# Patient Record
Sex: Male | Born: 1980
Health system: Southern US, Community
[De-identification: ages and names within clinical notes are randomized; demographics above are authoritative.]

## PROBLEM LIST (undated history)

## (undated) DIAGNOSIS — M109 Gout, unspecified: Secondary | ICD-10-CM

## (undated) DIAGNOSIS — N200 Calculus of kidney: Secondary | ICD-10-CM

## (undated) DIAGNOSIS — I10 Essential (primary) hypertension: Secondary | ICD-10-CM

## (undated) DIAGNOSIS — K219 Gastro-esophageal reflux disease without esophagitis: Secondary | ICD-10-CM

## (undated) DIAGNOSIS — E7800 Pure hypercholesterolemia, unspecified: Secondary | ICD-10-CM

## (undated) DIAGNOSIS — T7840XA Allergy, unspecified, initial encounter: Secondary | ICD-10-CM

## (undated) HISTORY — DX: Allergy, unspecified, initial encounter: T78.40XA

## (undated) HISTORY — PX: TONSILLECTOMY: SUR1361

## (undated) HISTORY — DX: Gout, unspecified: M10.9

## (undated) HISTORY — DX: Gastro-esophageal reflux disease without esophagitis: K21.9

---

## 1999-07-29 ENCOUNTER — Emergency Department (HOSPITAL_COMMUNITY): Admission: EM | Admit: 1999-07-29 | Discharge: 1999-07-29 | Payer: Self-pay | Admitting: Emergency Medicine

## 2007-03-13 ENCOUNTER — Encounter: Admission: RE | Admit: 2007-03-13 | Discharge: 2007-03-13 | Payer: Self-pay | Admitting: Internal Medicine

## 2007-12-04 ENCOUNTER — Emergency Department (HOSPITAL_COMMUNITY): Admission: EM | Admit: 2007-12-04 | Discharge: 2007-12-04 | Payer: Self-pay | Admitting: Emergency Medicine

## 2011-09-11 ENCOUNTER — Emergency Department (HOSPITAL_BASED_OUTPATIENT_CLINIC_OR_DEPARTMENT_OTHER)
Admission: EM | Admit: 2011-09-11 | Discharge: 2011-09-11 | Disposition: A | Discharge status: Home / Self Care | Payer: 59 | Attending: Emergency Medicine | Admitting: Emergency Medicine

## 2011-09-11 ENCOUNTER — Emergency Department (HOSPITAL_BASED_OUTPATIENT_CLINIC_OR_DEPARTMENT_OTHER): Payer: 59

## 2011-09-11 ENCOUNTER — Encounter (HOSPITAL_BASED_OUTPATIENT_CLINIC_OR_DEPARTMENT_OTHER): Payer: Self-pay | Admitting: *Deleted

## 2011-09-11 DIAGNOSIS — N200 Calculus of kidney: Secondary | ICD-10-CM

## 2011-09-11 DIAGNOSIS — R110 Nausea: Secondary | ICD-10-CM | POA: Insufficient documentation

## 2011-09-11 DIAGNOSIS — N201 Calculus of ureter: Secondary | ICD-10-CM | POA: Insufficient documentation

## 2011-09-11 DIAGNOSIS — R109 Unspecified abdominal pain: Secondary | ICD-10-CM | POA: Insufficient documentation

## 2011-09-11 DIAGNOSIS — M545 Low back pain, unspecified: Secondary | ICD-10-CM | POA: Insufficient documentation

## 2011-09-11 DIAGNOSIS — Z79899 Other long term (current) drug therapy: Secondary | ICD-10-CM | POA: Insufficient documentation

## 2011-09-11 LAB — URINALYSIS, ROUTINE W REFLEX MICROSCOPIC
Bilirubin Urine: NEGATIVE
Glucose, UA: NEGATIVE mg/dL

## 2011-09-11 LAB — URINE MICROSCOPIC-ADD ON

## 2011-09-11 MED ORDER — ONDANSETRON HCL 4 MG/2ML IJ SOLN
4.0000 mg | Freq: Once | INTRAMUSCULAR | Status: AC
  Administered 2011-09-11: 4 mg via INTRAVENOUS
  Filled 2011-09-11: qty 2

## 2011-09-11 MED ORDER — OXYCODONE-ACETAMINOPHEN 5-325 MG PO TABS: 2.0000 | ORAL_TABLET | ORAL | Status: AC | PRN

## 2011-09-11 MED ORDER — MORPHINE SULFATE 4 MG/ML IJ SOLN
4.0000 mg | Freq: Once | INTRAMUSCULAR | Status: AC
  Administered 2011-09-11: 4 mg via INTRAVENOUS
  Filled 2011-09-11: qty 1

## 2011-09-11 MED ORDER — KETOROLAC TROMETHAMINE 30 MG/ML IJ SOLN
30.0000 mg | Freq: Once | INTRAMUSCULAR | Status: AC
  Administered 2011-09-11: 30 mg via INTRAVENOUS
  Filled 2011-09-11: qty 1

## 2011-09-11 MED ORDER — MORPHINE SULFATE 4 MG/ML IJ SOLN
6.0000 mg | Freq: Once | INTRAMUSCULAR | Status: AC
  Administered 2011-09-11: 6 mg via INTRAVENOUS
  Filled 2011-09-11: qty 1

## 2011-09-11 MED ORDER — MORPHINE SULFATE 2 MG/ML IJ SOLN
INTRAMUSCULAR | Status: AC
  Administered 2011-09-11: 6 mg via INTRAVENOUS
  Filled 2011-09-11: qty 1

## 2011-09-11 NOTE — ED Notes (Signed)
Patient having lower back pain, left lower abd pain, radiating into groin area. Started appx an hour ago

## 2011-09-11 NOTE — ED Provider Notes (Signed)
History     CSN: [id number]  Arrival date & time 09/11/11  0351   First MD Initiated Contact with Patient 09/11/11 0401      Chief Complaint  Patient presents with  . Abdominal Pain    (Consider location/radiation/quality/duration/timing/severity/associated sxs/prior treatment) HPI Comments: Patient presents tonight with complaints of sudden onset of pain is left lower back area. He states it radiates down to his left lower abdomen, and down into his groin area. He's had some decreased urination, but denies any pain on urination or hematuria. He got some nausea no vomiting. Denies any fevers. He had some mild pain last night and it went away and then came back suddenly tonight about an hour and a half ago and was much more intense this evening. He denies a history of pain like this in the past. Denies a history kidney stones.  Patient is a 31 y.o. male presenting with abdominal pain. The history is provided by the patient.  Abdominal Pain The primary symptoms of the illness include abdominal pain and nausea. The primary symptoms of the illness do not include fever, fatigue, shortness of breath, vomiting, diarrhea or dysuria. The current episode started 1 to 2 hours ago. The onset of the illness was sudden. The problem has been gradually worsening.  The illness is associated with awakening from sleep. The patient has not had a change in bowel habit. Additional symptoms associated with the illness include back pain. Symptoms associated with the illness do not include chills, diaphoresis, hematuria or frequency.    History reviewed. No pertinent past medical history.  History reviewed. No pertinent past surgical history.  No family history on file.  History  Substance Use Topics  . Smoking status: Not on file  . Smokeless tobacco: Not on file  . Alcohol Use: Not on file      Review of Systems  Constitutional: Negative for fever, chills, diaphoresis and fatigue.  HENT: Negative  for congestion, rhinorrhea and sneezing.   Eyes: Negative.   Respiratory: Negative for cough, chest tightness and shortness of breath.   Cardiovascular: Negative for chest pain and leg swelling.  Gastrointestinal: Positive for nausea and abdominal pain. Negative for vomiting, diarrhea and blood in stool.  Genitourinary: Positive for flank pain and decreased urine volume. Negative for dysuria, frequency, hematuria, difficulty urinating and testicular pain.  Musculoskeletal: Positive for back pain. Negative for arthralgias.  Skin: Negative for rash.  Neurological: Negative for dizziness, speech difficulty, weakness, numbness and headaches.    Allergies  Penicillins  Home Medications   Current Outpatient Rx  Name Route Sig Dispense Refill  . FLUTICASONE PROPIONATE 50 MCG/ACT NA SUSP Nasal Place 2 sprays into the nose daily.    . OXYCODONE-ACETAMINOPHEN 5-325 MG PO TABS Oral Take 2 tablets by mouth every 4 (four) hours as needed for pain. 20 tablet 0    BP 178/107  Pulse 64  Resp 20  SpO2 100%  Physical Exam  Constitutional: He is oriented to person, place, and time. He appears well-developed and well-nourished.  HENT:  Head: Normocephalic and atraumatic.  Eyes: Pupils are equal, round, and reactive to light.  Neck: Normal range of motion. Neck supple.  Cardiovascular: Normal rate, regular rhythm and normal heart sounds.   Pulmonary/Chest: Effort normal and breath sounds normal. No respiratory distress. He has no wheezes. He has no rales. He exhibits no tenderness.  Abdominal: Soft. Bowel sounds are normal. There is tenderness. There is no rebound and no guarding.  Moderate tenderness to the left midabdomen and left CVA area. There is no testicular pain. No groin masses are noted  Musculoskeletal: Normal range of motion. He exhibits no edema.  Lymphadenopathy:    He has no cervical adenopathy.  Neurological: He is alert and oriented to person, place, and time.  Skin: Skin is  warm and dry. No rash noted.  Psychiatric: He has a normal mood and affect.    ED Course  Procedures (including critical care time)  Results for orders placed during the hospital encounter of 09/11/11  URINALYSIS, ROUTINE W REFLEX MICROSCOPIC      Component Value Range   Color, Urine YELLOW  YELLOW    APPearance CLEAR  CLEAR    Specific Gravity, Urine 1.027  1.005 - 1.030    pH 5.5  5.0 - 8.0    Glucose, UA NEGATIVE  NEGATIVE (mg/dL)   Hgb urine dipstick TRACE (*) NEGATIVE    Bilirubin Urine NEGATIVE  NEGATIVE    Ketones, ur NEGATIVE  NEGATIVE (mg/dL)   Protein, ur NEGATIVE  NEGATIVE (mg/dL)   Urobilinogen, UA 0.2  0.0 - 1.0 (mg/dL)   Nitrite NEGATIVE  NEGATIVE    Leukocytes, UA NEGATIVE  NEGATIVE   URINE MICROSCOPIC-ADD ON      Component Value Range   Squamous Epithelial / LPF RARE  RARE    WBC, UA 0-2  <3 (WBC/hpf)   RBC / HPF 0-2  <3 (RBC/hpf)   Bacteria, UA FEW (*) RARE    Urine-Other MUCOUS PRESENT     Ct Abdomen Pelvis Wo Contrast  09/11/2011  *RADIOLOGY REPORT*  Clinical Data: Left flank pain radiating to the groin area.  CT ABDOMEN AND PELVIS WITHOUT CONTRAST  Technique:  Multidetector CT imaging of the abdomen and pelvis was performed following the standard protocol without intravenous contrast.  Comparison: None.  Findings: The lung bases are clear.  There is a punctate sized calcification in the distal left ureter at the ureterovesicle junction.  There is mild proximal ureterectasis and pyelocaliectasis with mild periureteral stranding consistent with mild obstruction.  No intrarenal stones are demonstrated.  The right ureter is decompressed.  The bladder is decompressed.  The unenhanced appearance of the liver, spleen, gallbladder, pancreas, adrenal glands, abdominal aorta, and retroperitoneal lymph nodes is unremarkable.  The stomach, small bowel, and colon are decompressed.  No free air or free fluid in the abdomen.  Pelvis:  The appendix is normal.  The prostate gland is  not enlarged.  No free or loculated pelvic fluid collections.  No inflammatory change in the sigmoid colon.  Normal alignment of the lumbar spine.  IMPRESSION: Punctate sized stone in the distal left ureter with mild proximal obstruction.  Original Report Authenticated By: Marlon Pel, M.D.      1. Kidney stone       MDM  Pt with small distal ureteral stone.  No infection.  Given pain meds, urine strainer.  To f/u with urologist.        Rolan Bucco, MD 09/11/11 0500

## 2011-09-11 NOTE — Discharge Instructions (Signed)

## 2013-11-28 ENCOUNTER — Encounter (HOSPITAL_COMMUNITY): Payer: Self-pay | Admitting: Emergency Medicine

## 2013-11-28 ENCOUNTER — Emergency Department (HOSPITAL_COMMUNITY): Payer: Worker's Compensation

## 2013-11-28 ENCOUNTER — Emergency Department (HOSPITAL_COMMUNITY)
Admission: EM | Admit: 2013-11-28 | Discharge: 2013-11-29 | Disposition: A | Discharge status: Home / Self Care | Payer: Worker's Compensation | Attending: Emergency Medicine | Admitting: Emergency Medicine

## 2013-11-28 DIAGNOSIS — R0789 Other chest pain: Secondary | ICD-10-CM | POA: Insufficient documentation

## 2013-11-28 DIAGNOSIS — I10 Essential (primary) hypertension: Secondary | ICD-10-CM | POA: Insufficient documentation

## 2013-11-28 DIAGNOSIS — Z87442 Personal history of urinary calculi: Secondary | ICD-10-CM | POA: Insufficient documentation

## 2013-11-28 DIAGNOSIS — M546 Pain in thoracic spine: Secondary | ICD-10-CM

## 2013-11-28 DIAGNOSIS — Z862 Personal history of diseases of the blood and blood-forming organs and certain disorders involving the immune mechanism: Secondary | ICD-10-CM | POA: Insufficient documentation

## 2013-11-28 DIAGNOSIS — R51 Headache: Secondary | ICD-10-CM | POA: Insufficient documentation

## 2013-11-28 DIAGNOSIS — M549 Dorsalgia, unspecified: Secondary | ICD-10-CM | POA: Insufficient documentation

## 2013-11-28 DIAGNOSIS — Z8639 Personal history of other endocrine, nutritional and metabolic disease: Secondary | ICD-10-CM | POA: Insufficient documentation

## 2013-11-28 DIAGNOSIS — Z880 Allergy status to penicillin: Secondary | ICD-10-CM | POA: Insufficient documentation

## 2013-11-28 DIAGNOSIS — IMO0002 Reserved for concepts with insufficient information to code with codable children: Secondary | ICD-10-CM | POA: Insufficient documentation

## 2013-11-28 HISTORY — DX: Essential (primary) hypertension: I10

## 2013-11-28 HISTORY — DX: Pure hypercholesterolemia, unspecified: E78.00

## 2013-11-28 HISTORY — DX: Calculus of kidney: N20.0

## 2013-11-28 LAB — CBC WITH DIFFERENTIAL/PLATELET
BASOS PCT: 1 % (ref 0–1)
Basophils Absolute: 0.1 10*3/uL (ref 0.0–0.1)
EOS ABS: 0.1 10*3/uL (ref 0.0–0.7)
EOS PCT: 1 % (ref 0–5)
HCT: 42.1 % (ref 39.0–52.0)
Hemoglobin: 14.4 g/dL (ref 13.0–17.0)
LYMPHS ABS: 2 10*3/uL (ref 0.7–4.0)
LYMPHS PCT: 21 % (ref 12–46)
MCH: 29.2 pg (ref 26.0–34.0)
MCHC: 34.2 g/dL (ref 30.0–36.0)
MCV: 85.4 fL (ref 78.0–100.0)
MONO ABS: 0.6 10*3/uL (ref 0.1–1.0)
Monocytes Relative: 6 % (ref 3–12)
NEUTROS ABS: 7 10*3/uL (ref 1.7–7.7)
NEUTROS PCT: 71 % (ref 43–77)
PLATELETS: 269 10*3/uL (ref 150–400)
RBC: 4.93 MIL/uL (ref 4.22–5.81)
RDW: 13.8 % (ref 11.5–15.5)
WBC: 9.8 10*3/uL (ref 4.0–10.5)

## 2013-11-28 LAB — BASIC METABOLIC PANEL
Anion gap: 15 (ref 5–15)
BUN: 17 mg/dL (ref 6–23)
CHLORIDE: 102 meq/L (ref 96–112)
CO2: 24 mEq/L (ref 19–32)
Calcium: 9.3 mg/dL (ref 8.4–10.5)
Creatinine, Ser: 1.37 mg/dL — ABNORMAL HIGH (ref 0.50–1.35)
GFR calc Af Amer: 77 mL/min — ABNORMAL LOW (ref >90)
GFR calc non Af Amer: 66 mL/min — ABNORMAL LOW (ref >90)
GLUCOSE: 109 mg/dL — AB (ref 70–99)
POTASSIUM: 4.1 meq/L (ref 3.7–5.3)
SODIUM: 141 meq/L (ref 137–147)

## 2013-11-28 LAB — I-STAT TROPONIN, ED
Troponin i, poc: 0.01 ng/mL (ref 0.00–0.08)
Troponin i, poc: 0 ng/mL (ref 0.00–0.08)

## 2013-11-28 MED ORDER — METOCLOPRAMIDE HCL 5 MG/ML IJ SOLN
10.0000 mg | Freq: Once | INTRAMUSCULAR | Status: AC
  Administered 2013-11-28: 10 mg via INTRAVENOUS
  Filled 2013-11-28: qty 2

## 2013-11-28 MED ORDER — DIAZEPAM 5 MG PO TABS
5.0000 mg | ORAL_TABLET | Freq: Once | ORAL | Status: AC
  Administered 2013-11-28: 5 mg via ORAL
  Filled 2013-11-28: qty 1

## 2013-11-28 MED ORDER — HYDROMORPHONE HCL PF 1 MG/ML IJ SOLN
0.5000 mg | Freq: Once | INTRAMUSCULAR | Status: AC
  Administered 2013-11-28: 0.5 mg via INTRAVENOUS
  Filled 2013-11-28: qty 1

## 2013-11-28 MED ORDER — DIPHENHYDRAMINE HCL 50 MG/ML IJ SOLN
12.5000 mg | Freq: Once | INTRAMUSCULAR | Status: AC
  Administered 2013-11-28: 12.5 mg via INTRAVENOUS
  Filled 2013-11-28: qty 1

## 2013-11-28 MED ORDER — SODIUM CHLORIDE 0.9 % IV BOLUS (SEPSIS)
1000.0000 mL | Freq: Once | INTRAVENOUS | Status: AC
  Administered 2013-11-28: 1000 mL via INTRAVENOUS

## 2013-11-28 MED ORDER — ORPHENADRINE CITRATE ER 100 MG PO TB12: 100.0000 mg | ORAL_TABLET | Freq: Two times a day (BID) | ORAL | Status: DC | PRN

## 2013-11-28 MED ORDER — ONDANSETRON HCL 4 MG/2ML IJ SOLN
4.0000 mg | Freq: Once | INTRAMUSCULAR | Status: AC
  Administered 2013-11-28: 4 mg via INTRAVENOUS
  Filled 2013-11-28: qty 2

## 2013-11-28 MED ORDER — HYDROCODONE-ACETAMINOPHEN 5-325 MG PO TABS: 1.0000 | ORAL_TABLET | ORAL | Status: DC | PRN

## 2013-11-28 NOTE — ED Notes (Signed)
Patient transported to X-ray 

## 2013-11-28 NOTE — ED Provider Notes (Signed)
CSN: [id number]     Arrival date & time 11/28/13  1804 History   First MD Initiated Contact with Patient 11/28/13 1830     Chief Complaint  Patient presents with  . Back Pain  . Chest Pain     (Consider location/radiation/quality/duration/timing/severity/associated sxs/prior Treatment) HPI   Jose James is a 33 y.o. male with a past medical history of hypertension, high cholesterol, and kidney stones, presenting for left-sided back pain and chest pain. 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 Follow training exercise he acute onset of left-sided thoracic back pain which radiated to his chest. Patient states pain feels like something "boring" into his chest from his back. Patient also states that he was assessed for his blood pressure at that time his blood pressure was fluctuating between 160s/90s and 200s/100s.  Patient states he gets an annual stress test, as part of his fire fighting physical. Stress tests have been negative thus far. Patient was given multiple doses of nitroglycerin prior to arrival, with improvement of his blood pressure, patient states she has a significant headache at this time. Chest pain is now a 6/10. Patient also received aspirin prior to arrival as well. Patient denies nausea, diaphoresis, shortness of breath, abdominal pain, vomiting, fever, chills, or other associated symptoms.   Past Medical History  Diagnosis Date  . Hypertension   . High cholesterol   . Kidney stone    Past Surgical History  Procedure Laterality Date  . Tonsillectomy     No family history on file. History  Substance Use Topics  . Smoking status: Never Smoker   . Smokeless tobacco: Not on file  . Alcohol Use: Yes     Comment: ocassional    Review of Systems  Constitutional: Negative.   Eyes: Negative.   Respiratory: Negative.   Cardiovascular: Positive for chest pain.  Gastrointestinal: Negative.   Endocrine: Negative.    Genitourinary: Negative.   Musculoskeletal: Positive for back pain.  Skin: Negative.   Allergic/Immunologic: Negative.   Neurological: Positive for dizziness, light-headedness and headaches.  Hematological: Negative.   Psychiatric/Behavioral: Negative.       Allergies  Penicillins  Home Medications   Prior to Admission medications   Medication Sig Start Date End Date Taking? Authorizing Provider  HYDROcodone-acetaminophen (NORCO/VICODIN) 5-325 MG per tablet Take 1 tablet by mouth every 4 (four) hours as needed for moderate pain or severe pain. 11/28/13   Gavin PoundJustin Taydem Cavagnaro, MD  orphenadrine (NORFLEX) 100 MG tablet Take 1 tablet (100 mg total) by mouth 2 (two) times daily as needed for muscle spasms. 11/28/13   Gavin PoundJustin Ohn Bostic, MD   BP 135/93  Pulse 87  Temp(Src) 98.7 F (37.1 C) (Oral)  Resp 12  Ht 5\' 11"  (1.803 m)  Wt 236 lb (107.049 kg)  BMI 32.93 kg/m2  SpO2 100% Physical Exam  Nursing note and vitals reviewed. Constitutional: He is oriented to person, place, and time. He appears well-developed and well-nourished. No distress.  HENT:  Head: Normocephalic and atraumatic.  Right Ear: External ear normal.  Left Ear: External ear normal.  Nose: Nose normal.  Mouth/Throat: Oropharynx is clear and moist. No oropharyngeal exudate.  Eyes: Conjunctivae and EOM are normal. Pupils are equal, round, and reactive to light. Right eye exhibits no discharge. Left eye exhibits no discharge. No scleral icterus.  Neck: Normal range of motion. Neck supple. No JVD present. No tracheal deviation present. No thyromegaly present.  Cardiovascular: Normal rate, regular rhythm, normal heart sounds  and intact distal pulses.  Exam reveals no gallop and no friction rub.   No murmur heard. Pulmonary/Chest: Effort normal and breath sounds normal. No stridor. No respiratory distress. He has no wheezes. He has no rales. He exhibits no tenderness.  Abdominal: Soft. Bowel sounds are normal. He exhibits no  distension. There is no tenderness. There is no rebound and no guarding.  Musculoskeletal: Normal range of motion. He exhibits no edema and no tenderness.  Lymphadenopathy:    He has no cervical adenopathy.  Neurological: He is alert and oriented to person, place, and time. He has normal strength and normal reflexes. He is not disoriented. He displays no atrophy, no tremor and normal reflexes. No cranial nerve deficit or sensory deficit. He exhibits normal muscle tone. He displays no seizure activity. GCS eye subscore is 4. GCS verbal subscore is 5. GCS motor subscore is 6.  Skin: Skin is warm and dry. No rash noted. He is not diaphoretic. No erythema. No pallor.  Psychiatric: He has a normal mood and affect. His behavior is normal. Judgment and thought content normal.    ED Course  Procedures (including critical care time) Labs Review Labs Reviewed  BASIC METABOLIC PANEL - Abnormal; Notable for the following:    Glucose, Bld 109 (*)    Creatinine, Ser 1.37 (*)    GFR calc non Af Amer 66 (*)    GFR calc Af Amer 77 (*)    All other components within normal limits  CBC WITH DIFFERENTIAL  I-STAT TROPOININ, ED  Rosezena Sensor, ED    Imaging Review Dg Chest 2 View  11/28/2013   CLINICAL DATA:  Chest pain.  EXAM: CHEST  2 VIEW  COMPARISON:  No priors.  FINDINGS: Lung volumes are normal. No consolidative airspace disease. No pleural effusions. No pneumothorax. No pulmonary nodule or mass noted. Pulmonary vasculature and the cardiomediastinal silhouette are within normal limits.  IMPRESSION: No radiographic evidence of acute cardiopulmonary disease.   Electronically Signed   By: Trudie Reed M.D.   On: 11/28/2013 21:17     EKG Interpretation   Date/Time:  Thursday November 28 2013 18:11:51 EDT Ventricular Rate:  83 PR Interval:  124 QRS Duration: 93 QT Interval:  359 QTC Calculation: 422 R Axis:   47 Text Interpretation:  Sinus rhythm Confirmed by Rubin Payor  MD, Harrold Donath  [phone]) on  11/28/2013 7:15:18 PM      MDM   Final diagnoses:  Left-sided thoracic back pain  Musculoskeletal chest pain   At this time primary concern would be for ACS. Would consider aortic dissection, however patient's pressure is now well controlled, and pain is much improved. Patient otherwise is in no acute distress. Low likelihood of pulmonary cause, has no association with breathing. Chest wall pain is likely in the setting of recent significant activity. Patient does endorse reproduced pain with palpation of his paraspinal muscles, but states that the pain is "deep" to this.  Low concern for PE as patient's heart rate is not tachycardic, and he has normal oxygen saturations, with no associated shortness of breath.  Will obtain chest x-ray, EKG, labs, troponin levels, and will reassess following medications for pain.  Laboratory workup significant for elevated creatinine. Possibly secondary to dehydration. Patient also endorses frequent use of NSAIDs, and has possibly had uncontrolled hypertension for a number of years, but he is unsure of how long he has had hypertension.  No troponin elevation x2 on workup, no other significant abnormalities. Chest x-ray, and EKG within  normal limits. Patient advised to follow up with PCP for further recommendations, and risk modifying medications as indicated. Patient noted improvement in his headache and back pain with muscle relaxants and pain control. We'll DC home with appropriate pain control medication, and muscle relaxants. Patient advised to avoid NSAIDs do to his finding of elevated creatinine, which will also need further PCP followup.  Patient and family given return precautions for chest pain.  Advised to return for worsening symptoms including chest pain, shortness of breath, severe headache, intractable nausea or vomiting, fever, or chills, inability to take medications, or other acute concerns.  Advised to follow up with PCP in 3 days.  Patient and  family in agreement with and expressed understanding of follow plan, plan of care, and return precautions.  All questions answered prior to discharge.  Patient was discharged in stable condition with family, ambulating without difficulty.  Patient care was discussed with my attending, Dr. Rubin Payor.     Gavin Pound, MD 11/30/13 [id number]
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
## 2013-11-28 NOTE — ED Notes (Signed)
EMS-pt reports he started having middle of the back pain radiating to the center of chest with no associated symptoms. 6/10 pain given 324 asa and 1 nitro with no relief. 18g(R)AC. BP 180 right arm BP 160 left arm.

## 2013-11-28 NOTE — Discharge Instructions (Signed)

## 2013-11-29 NOTE — ED Provider Notes (Signed)
  Physical Exam  BP 129/85  Pulse 82  Temp(Src) 98.3 F (36.8 C) (Oral)  Resp 16  Ht 5\' 11"  (1.803 m)  Wt 236 lb (107.049 kg)  BMI 32.93 kg/m2  SpO2 98%  Physical Exam  ED Course  Procedures  MDM Patient chest pain radiating to the back.. doubt aortic pathology. Enzymes negative. Patient has annual stress tests and has another one scheduled in December. Will discharge home.     EKG Interpretation  Date/Time:  Thursday November 28 2013 18:11:51 EDT Ventricular Rate:  83 PR Interval:  124 QRS Duration: 93 QT Interval:  359 QTC Calculation: 422 R Axis:   47 Text Interpretation:  Sinus rhythm Confirmed by Rubin PayorPICKERING  MD, Kiaira Pointer [phone] on 11/28/2013 7:15:18 PM        Juliet RudeNathan R. Rubin PayorPickering, MD 11/29/13 1450

## 2013-11-30 NOTE — ED Provider Notes (Signed)
I saw and evaluated the patient, reviewed the resident's note and I agree with the findings and plan.   EKG Interpretation   Date/Time:  Thursday November 28 2013 18:11:51 EDT Ventricular Rate:  83 PR Interval:  124 QRS Duration: 93 QT Interval:  359 QTC Calculation: 422 R Axis:   47 Text Interpretation:  Sinus rhythm Confirmed by Rubin PayorPICKERING  MD, Shereese Bonnie  [phone]([id number]) on 11/28/2013 7:15:18 PM        Juliet RudeNathan R. Rubin PayorPickering, MD 11/30/13 1430

## 2013-12-30 DIAGNOSIS — I10 Essential (primary) hypertension: Secondary | ICD-10-CM | POA: Insufficient documentation

## 2019-06-01 ENCOUNTER — Other Ambulatory Visit: Payer: Self-pay

## 2019-06-01 ENCOUNTER — Emergency Department (HOSPITAL_COMMUNITY)
Admission: EM | Admit: 2019-06-01 | Discharge: 2019-06-01 | Disposition: A | Discharge status: Home / Self Care | Payer: 59 | Attending: Emergency Medicine | Admitting: Emergency Medicine

## 2019-06-01 ENCOUNTER — Emergency Department (HOSPITAL_COMMUNITY): Payer: 59

## 2019-06-01 DIAGNOSIS — Y9389 Activity, other specified: Secondary | ICD-10-CM | POA: Insufficient documentation

## 2019-06-01 DIAGNOSIS — S5401XA Injury of ulnar nerve at forearm level, right arm, initial encounter: Secondary | ICD-10-CM | POA: Insufficient documentation

## 2019-06-01 DIAGNOSIS — S59901A Unspecified injury of right elbow, initial encounter: Secondary | ICD-10-CM | POA: Diagnosis present

## 2019-06-01 DIAGNOSIS — S5011XA Contusion of right forearm, initial encounter: Secondary | ICD-10-CM | POA: Diagnosis not present

## 2019-06-01 DIAGNOSIS — Y9289 Other specified places as the place of occurrence of the external cause: Secondary | ICD-10-CM | POA: Diagnosis not present

## 2019-06-01 DIAGNOSIS — W010XXA Fall on same level from slipping, tripping and stumbling without subsequent striking against object, initial encounter: Secondary | ICD-10-CM | POA: Insufficient documentation

## 2019-06-01 DIAGNOSIS — Y990 Civilian activity done for income or pay: Secondary | ICD-10-CM | POA: Insufficient documentation

## 2019-06-01 DIAGNOSIS — I10 Essential (primary) hypertension: Secondary | ICD-10-CM | POA: Insufficient documentation

## 2019-06-01 NOTE — Discharge Instructions (Signed)
Make sure you are straightening your arm and stretching.  Avoid hitting the arm and no heavy lifting with that arm.

## 2019-06-01 NOTE — ED Notes (Signed)
Off floor to xray.
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
## 2019-06-01 NOTE — ED Notes (Signed)
Back in  Room

## 2020-07-02 ENCOUNTER — Encounter: Payer: Self-pay | Admitting: Family Medicine

## 2020-07-02 ENCOUNTER — Ambulatory Visit: Payer: 59 | Admitting: Family Medicine

## 2020-07-02 ENCOUNTER — Other Ambulatory Visit: Payer: Self-pay

## 2020-07-02 VITALS — BP 132/83 | HR 83 | Temp 97.9°F | Ht 71.0 in | Wt 250.4 lb

## 2020-07-02 DIAGNOSIS — M109 Gout, unspecified: Secondary | ICD-10-CM | POA: Insufficient documentation

## 2020-07-02 DIAGNOSIS — B001 Herpesviral vesicular dermatitis: Secondary | ICD-10-CM

## 2020-07-02 DIAGNOSIS — I10 Essential (primary) hypertension: Secondary | ICD-10-CM | POA: Diagnosis not present

## 2020-07-02 DIAGNOSIS — H93A9 Pulsatile tinnitus, unspecified ear: Secondary | ICD-10-CM | POA: Diagnosis not present

## 2020-07-02 DIAGNOSIS — Z1211 Encounter for screening for malignant neoplasm of colon: Secondary | ICD-10-CM

## 2020-07-02 MED ORDER — ALLOPURINOL 100 MG PO TABS: 100.0000 mg | ORAL_TABLET | Freq: Every day | ORAL | 3 refills | Status: DC

## 2020-07-02 MED ORDER — LOSARTAN POTASSIUM-HCTZ 100-12.5 MG PO TABS: 1.0000 | ORAL_TABLET | Freq: Every day | ORAL | 3 refills | Status: DC

## 2020-07-02 NOTE — Progress Notes (Signed)
Jose James is a 40 y.o. male who presents today for an office visit.  Assessment/Plan:  Chronic Problems Addressed Today: Gout Refilled allopurinol today.  No recent flares.  We are additionally switching him from lisinopril to losartan.  Hopefully this should help reduce gout flares as well.  Cold sore Stable on Valtrex as needed flares up.  Pulsatile tinnitus Possibly related to blood pressure.  We are making some changes today as below.  Hypertension At goal here in office though has been elevated at home.  We will switch from lisinopril 20 mg twice daily to losartan 100-12.5.  He will check his blood pressures at home over the next few weeks and let me know how they are looking.  He will come back soon for CPE as well.  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  He is particularly worried about thyroid cancer and other forms of cancer.  We will check FOBT to screen for colon cancer.  He will be due for colonoscopy at age 89.  We can check PSA and labs including CBC, CMET, TSH, and urinalysis when he comes back in for CPE.  We will additionally place order for cardiac CT scan to screen for coronary artery disease.  He is up-to-date on other vaccines and screenings.    Subjective:  HPI:  Patient is here to establish care.  See A/P for status of chronic conditions.  He is mostly concerned about blood pressure control.  Home readings have sometimes been into the 140s to 160s over 100s to 110s.  Is also been having some issues with pulsatile tinnitus.  He saw ENT for this.  Had essentially normal work-up there and was told that he may have a blood vessel laying on a bone.   PMH:  The following were reviewed and entered/updated in epic: Past Medical History:  Diagnosis Date  . Allergy   . GERD (gastroesophageal reflux disease)   . Gout   . High cholesterol   . Hypertension   . Kidney stone    Patient Active Problem List    Diagnosis Date Noted  . Pulsatile tinnitus 07/02/2020  . Cold sore 07/02/2020  . Gout 07/02/2020  . Hypertension 12/30/2013   Past Surgical History:  Procedure Laterality Date  . TONSILLECTOMY      Family History  Problem Relation Age of Onset  . Diabetes Maternal Grandfather   . Hypertension Paternal Grandfather     Medications- reviewed and updated Current Outpatient Medications  Medication Sig Dispense Refill  . losartan-hydrochlorothiazide (HYZAAR) 100-12.5 MG tablet Take 1 tablet by mouth daily. 90 tablet 3  . meloxicam (MOBIC) 15 MG tablet Take 15 mg by mouth daily as needed.    . valACYclovir (VALTREX) 1000 MG tablet Take by mouth.    Marland Kitchen allopurinol (ZYLOPRIM) 100 MG tablet Take 1 tablet (100 mg total) by mouth daily. 90 tablet 3  . diazepam (VALIUM) 10 MG tablet TAKE 1 TABLET 2 HOURS PRIOR TO PROCEDURE AND TAKE THE OTHER TABLET IF NEEDED (Patient not taking: Reported on 07/02/2020)     No current facility-administered medications for this visit.    Allergies-reviewed and updated Allergies  Allergen Reactions  . Penicillins Other (See Comments)    Childhood allergy    Social History   Socioeconomic History  . Marital status: Single    Spouse name: Not on file  . Number of children: Not on file  . Years of education: Not on file  . Highest  education level: Not on file  Occupational History  . Not on file  Tobacco Use  . Smoking status: Never Smoker  . Smokeless tobacco: Not on file  Substance and Sexual Activity  . Alcohol use: Yes    Comment: ocassional  . Drug use: No  . Sexual activity: Not on file  Other Topics Concern  . Not on file  Social History Narrative  . Not on file   Social Determinants of Health   Financial Resource Strain: Not on file  Food Insecurity: Not on file  Transportation Needs: Not on file  Physical Activity: Not on file  Stress: Not on file  Social Connections: Not on file        Objective:  Physical Exam: BP 132/83    Pulse 83   Temp 97.9 F (36.6 C) (Temporal)   Ht 5\' 11"  (1.803 m)   Wt 250 lb 6.4 oz (113.6 kg)   SpO2 97%   BMI 34.92 kg/m   Gen: No acute distress, resting comfortably HEENT: TMs clear. CV: Regular rate and rhythm with no murmurs appreciated Pulm: Normal work of breathing, clear to auscultation bilaterally with no crackles, wheezes, or rhonchi Neuro: Grossly normal, moves all extremities Psych: Normal affect and thought content      Illias Pantano M. , MD 07/02/2020 2:07 PM

## 2020-07-02 NOTE — Assessment & Plan Note (Signed)
Refilled allopurinol today.  No recent flares.  We are additionally switching him from lisinopril to losartan.  Hopefully this should help reduce gout flares as well.

## 2020-07-02 NOTE — Assessment & Plan Note (Signed)
Possibly related to blood pressure.  We are making some changes today as below.

## 2020-07-02 NOTE — Assessment & Plan Note (Signed)
At goal here in office though has been elevated at home.  We will switch from lisinopril 20 mg twice daily to losartan 100-12.5.  He will check his blood pressures at home over the next few weeks and let me know how they are looking.  He will come back soon for CPE as well.

## 2020-07-02 NOTE — Patient Instructions (Signed)
It was very nice to see you today!  We will switch your lisinopril to losartan.  Please send a message in a couple weeks to let me know how your blood pressures are looking.  I will place an order for you to get a cardiac CT scan to evaluate for any calcium buildup in your heart.  Please come back soon for your annual physical with labs.  I will see back sooner if needed.  Take care, Dr Jimmey Ralph  PLEASE NOTE:  If you had any lab tests please let us know if you have not heard back within a few days. You may see your results on mychart before we have a chance to review them but we will give you a call once they are reviewed by [place]. If we ordered any referrals today, please let us know if you have not heard from their office within the next week.   Please try these tips to maintain a healthy lifestyle:   Eat at least 3 REAL meals and 1-2 snacks per day.  Aim for no more than 5 hours between eating.  If you eat breakfast, please do so within one hour of getting up.    Each meal should contain half fruits/vegetables, one quarter protein, and one quarter carbs (no bigger than a computer mouse)   Cut down on sweet beverages. This includes juice, soda, and sweet tea.     Drink at least 1 glass of water with each meal and aim for at least 8 glasses per day   Exercise at least 150 minutes every week.

## 2020-07-02 NOTE — Assessment & Plan Note (Signed)
Stable on Valtrex as needed flares up.

## 2020-07-30 ENCOUNTER — Encounter: Payer: Self-pay | Admitting: Family Medicine

## 2020-07-30 NOTE — Telephone Encounter (Signed)
See note

## 2020-08-10 ENCOUNTER — Other Ambulatory Visit: Payer: Self-pay | Admitting: *Deleted

## 2020-08-10 ENCOUNTER — Encounter: Payer: Self-pay | Admitting: Family Medicine

## 2020-08-10 DIAGNOSIS — G4730 Sleep apnea, unspecified: Secondary | ICD-10-CM

## 2020-08-10 NOTE — Telephone Encounter (Signed)
See note

## 2020-08-10 NOTE — Telephone Encounter (Signed)
Referral placed.

## 2020-09-14 ENCOUNTER — Encounter: Payer: Self-pay | Admitting: Family Medicine

## 2020-10-05 NOTE — Telephone Encounter (Signed)
Please see message and advise 

## 2020-10-06 ENCOUNTER — Encounter: Payer: Self-pay | Admitting: Family Medicine

## 2020-10-06 NOTE — Telephone Encounter (Signed)
Pt requesting Rx Meloxicam refill Last refill by historical provider

## 2020-10-07 MED ORDER — MELOXICAM 15 MG PO TABS: 15.0000 mg | ORAL_TABLET | Freq: Every day | ORAL | 1 refills | Status: DC | PRN

## 2020-10-07 NOTE — Telephone Encounter (Signed)
Spoke to pt told him Dr. Jimmey Ralph said okay to fill your Meloxicam Rx. What pharmacy [facility] in [place] or [facility] in [place]? Pt said [facility]. Told him okay will send Rx. Pt verbalized understanding. Rx sent.

## 2020-10-08 NOTE — Telephone Encounter (Signed)
Jose James, can you see if pt can be seen sooner or put on cancellation list.

## 2020-10-15 ENCOUNTER — Ambulatory Visit (INDEPENDENT_AMBULATORY_CARE_PROVIDER_SITE_OTHER)
Admission: RE | Admit: 2020-10-15 | Discharge: 2020-10-15 | Disposition: A | Discharge status: Home / Self Care | Payer: Self-pay | Source: Ambulatory Visit | Attending: Family Medicine | Admitting: Family Medicine

## 2020-10-15 ENCOUNTER — Other Ambulatory Visit: Payer: Self-pay

## 2020-10-15 DIAGNOSIS — I10 Essential (primary) hypertension: Secondary | ICD-10-CM

## 2020-10-16 NOTE — Progress Notes (Signed)
Please inform patient of the following:  Good news! Cardiac score is NORMAL. His lungs are also normal We can recheck again in 5 years.
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
## 2020-11-02 ENCOUNTER — Other Ambulatory Visit: Payer: Self-pay

## 2020-11-02 ENCOUNTER — Ambulatory Visit (INDEPENDENT_AMBULATORY_CARE_PROVIDER_SITE_OTHER): Payer: 59 | Admitting: Internal Medicine

## 2020-11-02 ENCOUNTER — Encounter: Payer: Self-pay | Admitting: Internal Medicine

## 2020-11-02 DIAGNOSIS — R0683 Snoring: Secondary | ICD-10-CM | POA: Diagnosis not present

## 2020-11-02 DIAGNOSIS — K219 Gastro-esophageal reflux disease without esophagitis: Secondary | ICD-10-CM | POA: Insufficient documentation

## 2020-11-02 DIAGNOSIS — G4726 Circadian rhythm sleep disorder, shift work type: Secondary | ICD-10-CM | POA: Insufficient documentation

## 2020-11-02 DIAGNOSIS — G4733 Obstructive sleep apnea (adult) (pediatric): Secondary | ICD-10-CM | POA: Insufficient documentation

## 2020-11-02 NOTE — Assessment & Plan Note (Signed)
This has awakened him and may need more attention if persistent.

## 2020-11-02 NOTE — Assessment & Plan Note (Signed)
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

## 2020-11-02 NOTE — Assessment & Plan Note (Signed)
History and exam favor obstructive sleep apnea. Appropriate discussion, questions answered. Safe driving emphasized.  Plan- sleep study, then likely CPAP would be first choice

## 2020-11-02 NOTE — Patient Instructions (Signed)
Order- schedule home sleep test    dx Snoring  Please call for results and recommendations about 2 weeks after your sleep study. If appropriate, we may be able to go ahead and start treatment before we see you next.

## 2020-11-16 ENCOUNTER — Telehealth: Payer: Self-pay | Admitting: Pulmonary Disease

## 2020-11-16 DIAGNOSIS — R0683 Snoring: Secondary | ICD-10-CM

## 2020-11-16 NOTE — Telephone Encounter (Signed)
Patient was seen on 11/02/20 by Dr. Maple Hudson in the notes states to do home sleep test. No order was placed

## 2020-11-16 NOTE — Telephone Encounter (Signed)
Called patient. I apologize for the order not being placed on 11/02/20. He is aware that I will place the order today. He verbalized understanding.   Nothing further needed at time of call.

## 2020-11-17 ENCOUNTER — Institutional Professional Consult (permissible substitution): Payer: 59 | Admitting: Pulmonary Disease

## 2020-11-28 ENCOUNTER — Other Ambulatory Visit: Payer: Self-pay | Admitting: Family Medicine

## 2020-12-02 ENCOUNTER — Ambulatory Visit: Payer: 59

## 2020-12-02 ENCOUNTER — Other Ambulatory Visit: Payer: Self-pay

## 2020-12-02 DIAGNOSIS — R0683 Snoring: Secondary | ICD-10-CM

## 2020-12-02 DIAGNOSIS — G4733 Obstructive sleep apnea (adult) (pediatric): Secondary | ICD-10-CM | POA: Diagnosis not present

## 2020-12-11 DIAGNOSIS — G4733 Obstructive sleep apnea (adult) (pediatric): Secondary | ICD-10-CM | POA: Diagnosis not present

## 2020-12-16 ENCOUNTER — Telehealth: Payer: Self-pay | Admitting: Internal Medicine

## 2020-12-16 DIAGNOSIS — G4733 Obstructive sleep apnea (adult) (pediatric): Secondary | ICD-10-CM

## 2020-12-16 NOTE — Telephone Encounter (Signed)
Noted.   AO please advise of HST results. Thanks :)

## 2020-12-22 NOTE — Telephone Encounter (Signed)
Home sleep test showed severe obstructive sleep apnea, averaging 38 apmeas/ hour. I recommend CPAP  Order- new DME, new CPAP auto 5-20, mask of choice, humidifier, supplies, AirView/ card  He needs ROV for CPAP in 31-90 days after he gets machine, per insurance regs.

## 2020-12-22 NOTE — Telephone Encounter (Signed)
Msg originally sent to Dr Wynona Neat instead of Dr Maple Hudson  I called C[facility] but there was no answer- left msg on machine letting her know we will ask CY results and call her  Dr Maple Hudson- please advise o HST results, thanks!

## 2020-12-22 NOTE — Telephone Encounter (Signed)
Cameo wife is checking on results of patient's home sleep test. [facility] phone number is [phone].

## 2020-12-22 NOTE — Telephone Encounter (Signed)
Spoke with the pt and notified of results of sleep study. Pt verbalized understanding and was agreeable to CPAP therapy. I have placed DME referral for this and pt aware to contact the office for 31-90 day f/u once they begin using machine per insurance requirement.   

## 2021-01-26 ENCOUNTER — Ambulatory Visit (INDEPENDENT_AMBULATORY_CARE_PROVIDER_SITE_OTHER): Payer: 59 | Admitting: Family Medicine

## 2021-01-26 ENCOUNTER — Encounter: Payer: Self-pay | Admitting: Family Medicine

## 2021-01-26 ENCOUNTER — Other Ambulatory Visit: Payer: Self-pay

## 2021-01-26 VITALS — BP 136/97 | HR 78 | Temp 98.0°F | Ht 70.0 in | Wt 250.6 lb

## 2021-01-26 DIAGNOSIS — Z125 Encounter for screening for malignant neoplasm of prostate: Secondary | ICD-10-CM

## 2021-01-26 DIAGNOSIS — I159 Secondary hypertension, unspecified: Secondary | ICD-10-CM

## 2021-01-26 DIAGNOSIS — G4733 Obstructive sleep apnea (adult) (pediatric): Secondary | ICD-10-CM

## 2021-01-26 DIAGNOSIS — Z0001 Encounter for general adult medical examination with abnormal findings: Secondary | ICD-10-CM | POA: Diagnosis not present

## 2021-01-26 DIAGNOSIS — Z9989 Dependence on other enabling machines and devices: Secondary | ICD-10-CM

## 2021-01-26 DIAGNOSIS — M109 Gout, unspecified: Secondary | ICD-10-CM | POA: Diagnosis not present

## 2021-01-26 DIAGNOSIS — Z13220 Encounter for screening for lipoid disorders: Secondary | ICD-10-CM | POA: Diagnosis not present

## 2021-01-26 DIAGNOSIS — Z1211 Encounter for screening for malignant neoplasm of colon: Secondary | ICD-10-CM

## 2021-01-26 DIAGNOSIS — Z131 Encounter for screening for diabetes mellitus: Secondary | ICD-10-CM

## 2021-01-26 LAB — LIPID PANEL
Cholesterol: 242 mg/dL — ABNORMAL HIGH (ref 0–200)
HDL: 34.3 mg/dL — ABNORMAL LOW (ref >39.00)
NonHDL: 208.12
Total CHOL/HDL Ratio: 7
Triglycerides: 278 mg/dL — ABNORMAL HIGH (ref 0.0–149.0)
VLDL: 55.6 mg/dL — ABNORMAL HIGH (ref 0.0–40.0)

## 2021-01-26 LAB — URINALYSIS, ROUTINE W REFLEX MICROSCOPIC
Bilirubin Urine: NEGATIVE
Hgb urine dipstick: NEGATIVE
Ketones, ur: NEGATIVE
Leukocytes,Ua: NEGATIVE
Nitrite: NEGATIVE
RBC / HPF: NONE SEEN (ref 0–?)
Specific Gravity, Urine: 1.025 (ref 1.000–1.030)
Total Protein, Urine: NEGATIVE
Urine Glucose: NEGATIVE
Urobilinogen, UA: 0.2 (ref 0.0–1.0)
pH: 6 (ref 5.0–8.0)

## 2021-01-26 LAB — HEMOGLOBIN A1C: Hgb A1c MFr Bld: 6.3 % (ref 4.6–6.5)

## 2021-01-26 LAB — CBC
HCT: 42.7 % (ref 39.0–52.0)
Hemoglobin: 14.4 g/dL (ref 13.0–17.0)
MCHC: 33.7 g/dL (ref 30.0–36.0)
MCV: 86.5 fl (ref 78.0–100.0)
Platelets: 302 10*3/uL (ref 150.0–400.0)
RBC: 4.94 Mil/uL (ref 4.22–5.81)
RDW: 13.8 % (ref 11.5–15.5)
WBC: 7.6 10*3/uL (ref 4.0–10.5)

## 2021-01-26 LAB — COMPREHENSIVE METABOLIC PANEL
ALT: 55 U/L — ABNORMAL HIGH (ref 0–53)
AST: 29 U/L (ref 0–37)
Albumin: 4.5 g/dL (ref 3.5–5.2)
Alkaline Phosphatase: 55 U/L (ref 39–117)
BUN: 16 mg/dL (ref 6–23)
CO2: 28 mEq/L (ref 19–32)
Calcium: 9.7 mg/dL (ref 8.4–10.5)
Chloride: 104 mEq/L (ref 96–112)
Creatinine, Ser: 1.33 mg/dL (ref 0.40–1.50)
GFR: 66.49 mL/min (ref >60.00)
Glucose, Bld: 112 mg/dL — ABNORMAL HIGH (ref 70–99)
Potassium: 4.5 mEq/L (ref 3.5–5.1)
Sodium: 140 mEq/L (ref 135–145)
Total Bilirubin: 0.8 mg/dL (ref 0.2–1.2)
Total Protein: 6.6 g/dL (ref 6.0–8.3)

## 2021-01-26 LAB — PSA: PSA: 0.31 ng/mL (ref 0.10–4.00)

## 2021-01-26 LAB — VITAMIN B12: Vitamin B-12: 235 pg/mL (ref 211–911)

## 2021-01-26 LAB — LDL CHOLESTEROL, DIRECT: Direct LDL: 158 mg/dL

## 2021-01-26 LAB — TSH: TSH: 3.62 u[IU]/mL (ref 0.35–5.50)

## 2021-01-26 LAB — URIC ACID: Uric Acid, Serum: 8.2 mg/dL — ABNORMAL HIGH (ref 4.0–7.8)

## 2021-01-26 NOTE — Patient Instructions (Signed)
It was very nice to see you today!  We will check blood work, urine sample, and a stool study today.  Please continue working with your CPAP machine.  Depending on the results your blood work we may need to start a weight loss medication.  We should continue working on diet and exercise.  I will see back in 1 year for your next physical.  Come back to see me sooner if needed.  Take care, Dr Jose James  PLEASE NOTE:  If you had any lab tests please let us know if you have not heard back within a few days. You may see your results on mychart before we have a chance to review them but we will give you a call once they are reviewed by [place]. If we ordered any referrals today, please let us know if you have not heard from their office within the next week.   Please try these tips to maintain a healthy lifestyle:  Eat at least 3 REAL meals and 1-2 snacks per day.  Aim for no more than 5 hours between eating.  If you eat breakfast, please do so within one hour of getting up.   Each meal should contain half fruits/vegetables, one quarter protein, and one quarter carbs (no bigger than a computer mouse)  Cut down on sweet beverages. This includes juice, soda, and sweet tea.   Drink at least 1 glass of water with each meal and aim for at least 8 glasses per day  Exercise at least 150 minutes every week.    Preventive Care 38-98 Years Old, Male Preventive care refers to lifestyle choices and visits with your health care provider that can promote health and wellness. This includes: A yearly physical exam. This is also called an annual wellness visit. Regular dental and eye exams. Immunizations. Screening for certain conditions. Healthy lifestyle choices, such as: Eating a healthy diet. Getting regular exercise. Not using drugs or products that contain nicotine and tobacco. Limiting alcohol use. What can I expect for my preventive care visit? Physical exam Your health care provider may check  your: Height and weight. These may be used to calculate your BMI (body mass index). BMI is a measurement that tells if you are at a healthy weight. Heart rate and blood pressure. Body temperature. Skin for abnormal spots. Counseling Your health care provider may ask you questions about your: Past medical problems. Family's medical history. Alcohol, tobacco, and drug use. Emotional well-being. Home life and relationship well-being. Sexual activity. Diet, exercise, and sleep habits. Work and work Astronomer. Access to firearms. What immunizations do I need? Vaccines are usually given at various ages, according to a schedule. Your health care provider will recommend vaccines for you based on your age, medical history, and lifestyle or other factors, such as travel or where you work. What tests do I need? Blood tests Lipid and cholesterol levels. These may be checked every 5 years starting at age 44. Hepatitis C test. Hepatitis B test. Screening  Diabetes screening. This is done by checking your blood sugar (glucose) after you have not eaten for a while (fasting). Genital exam to check for testicular cancer or hernias. STD (sexually transmitted disease) testing, if you are at risk. Talk with your health care provider about your test results, treatment options, and if necessary, the need for more tests. Follow these instructions at home: Eating and drinking  Eat a healthy diet that includes fresh fruits and vegetables, whole grains, lean protein, and low-fat dairy  products. Drink enough fluid to keep your urine pale yellow. Take vitamin and mineral supplements as recommended by your health care provider. Do not drink alcohol if your health care provider tells you not to drink. If you drink alcohol: Limit how much you have to 0-2 drinks a day. Be aware of how much alcohol is in your drink. In the [place] one drink equals one 12 oz bottle of beer (355 mL), one 5 oz glass of wine (148  mL), or one 1 oz glass of hard liquor (44 mL). Lifestyle Take daily care of your teeth and gums. Brush your teeth every morning and night with fluoride toothpaste. Floss one time each day. Stay active. Exercise for at least 30 minutes 5 or more days each week. Do not use any products that contain nicotine or tobacco, such as cigarettes, e-cigarettes, and chewing tobacco. If you need help quitting, ask your health care provider. Do not use drugs. If you are sexually active, practice safe sex. Use a condom or other form of protection to prevent STIs (sexually transmitted infections). Find healthy ways to cope with stress, such as: Meditation, yoga, or listening to music. Journaling. Talking to a trusted person. Spending time with friends and family. Safety Always wear your seat belt while driving or riding in a vehicle. Do not drive: If you have been drinking alcohol. Do not ride with someone who has been drinking. When you are tired or distracted. While texting. Wear a helmet and other protective equipment during sports activities. If you have firearms in your house, make sure you follow all gun safety procedures. Seek help if you have been physically or sexually abused. What's next? Go to your health care provider once a year for an annual wellness visit. Ask your health care provider how often you should have your eyes and teeth checked. Stay up to date on all vaccines. This information is not intended to replace advice given to you by your health care provider. Make sure you discuss any questions you have with your health care provider. Document Revised: 06/12/2020 Document Reviewed: 03/29/2018 Elsevier Patient Education  2022 [employer].

## 2021-01-26 NOTE — Assessment & Plan Note (Signed)
Discussed lifestyle modifications.  He is working on diet and exercise for this is difficult due to his work schedule and issues with fatigue.  We will check labs today.  Would consider starting semaglutide with NGITJL. If labs are normal.

## 2021-01-26 NOTE — Progress Notes (Signed)
Chief Complaint:  Jose James is a 40 y.o. male who presents today for his annual comprehensive physical exam.    Assessment/Plan:  New/Acute Problems: Fatigue Likely due to severe OSA.  We will check labs today to rule out any other possible causes.  Chronic Problems Addressed Today: Morbid obesity (HCC) Discussed lifestyle modifications.  He is working on diet and exercise for this is difficult due to his work schedule and issues with fatigue.  We will check labs today.  Would consider starting semaglutide with NATFTD. If labs are normal.  OSA on CPAP Follows with pulmonology.  On CPAP.  Likely contributing to his fatigue.  Gout No recent flares.  Continue allopurinol daily and meloxicam as needed.  Check uric acid level today.  Hypertension Diastolic elevated today.  He has been out of meds for the last couple of days.  We will check labs today.  Hopefully this will improve as he is treated for his OSA.  Continue home monitoring goal 140/90 or lower.   Body mass index is 35.96 kg/m. / Obese  BMI Metric Follow Up - 01/26/21 0956       BMI Metric Follow Up-Please document annually   BMI Metric Follow Up Education provided              Preventative Healthcare: Check labs.  Works as a IT sales professional.  We will check urinalysis and FOBT as well.  We will check early PSA.  Patient Counseling(The following topics were reviewed and/or handout was given):  -Nutrition: Stressed importance of moderation in sodium/caffeine intake, saturated fat and cholesterol, caloric balance, sufficient intake of fresh fruits, vegetables, and fiber.  -Stressed the importance of regular exercise.   -Substance Abuse: Discussed cessation/primary prevention of tobacco, alcohol, or other drug use; driving or other dangerous activities under the influence; availability of treatment for abuse.   -Injury prevention: Discussed safety belts, safety helmets, smoke detector, smoking near bedding or  upholstery.   -Sexuality: Discussed sexually transmitted diseases, partner selection, use of condoms, avoidance of unintended pregnancy and contraceptive alternatives.   -Dental health: Discussed importance of regular tooth brushing, flossing, and dental visits.  -Health maintenance and immunizations reviewed. Please refer to Health maintenance section.  Return to care in 1 year for next preventative visit.     Subjective:  HPI:  He has no acute complaints today.   He still have issue with sleep apnea. He reports he feels fatigued. He wakes up at night. He recently had sleep study done which showed OSA. He started CPAP a week ago. He thinks it is helping. He would like to get blood work done for this issue. He is also concerned about weight gain. He is having hard time losing weight. He would like to start [facility] if insurance improved.   Lifestyle Diet: Trying to follow balance diet Exercise: Limited.   Depression screen PHQ 2/9 01/26/2021  Decreased Interest 0  Down, Depressed, Hopeless 0  PHQ - 2 Score 0    Health Maintenance Due  Topic Date Due   HIV Screening  Never done   Hepatitis C Screening  Never done     ROS: Per HPI, otherwise a complete review of systems was negative.   PMH:  The following were reviewed and entered/updated in epic: Past Medical History:  Diagnosis Date   Allergy    GERD (gastroesophageal reflux disease)    Gout    High cholesterol    Hypertension    Kidney stone    Patient  Active Problem List   Diagnosis Date Noted   Morbid obesity (HCC) 01/26/2021   OSA on CPAP 11/02/2020   Shift work sleep disorder 11/02/2020   GERD (gastroesophageal reflux disease)    Pulsatile tinnitus 07/02/2020   Cold sore 07/02/2020   Gout 07/02/2020   Hypertension 12/30/2013   Past Surgical History:  Procedure Laterality Date   TONSILLECTOMY      Family History  Problem Relation Age of Onset   Diabetes Maternal Grandfather    Hypertension Paternal  Grandfather     Medications- reviewed and updated Current Outpatient Medications  Medication Sig Dispense Refill   allopurinol (ZYLOPRIM) 100 MG tablet Take 1 tablet (100 mg total) by mouth daily. 90 tablet 3   losartan-hydrochlorothiazide (HYZAAR) 100-12.5 MG tablet Take 1 tablet by mouth daily. 90 tablet 3   meloxicam (MOBIC) 15 MG tablet TAKE 1 TABLET(15 MG) BY MOUTH DAILY AS NEEDED 30 tablet 1   valACYclovir (VALTREX) 1000 MG tablet Take by mouth.     No current facility-administered medications for this visit.    Allergies-reviewed and updated Allergies  Allergen Reactions   Penicillins Other (See Comments)    Childhood allergy    Social History   Socioeconomic History   Marital status: Divorced    Spouse name: Not on file   Number of children: Not on file   Years of education: Not on file   Highest education level: Not on file  Occupational History   Not on file  Tobacco Use   Smoking status: Never   Smokeless tobacco: Not on file  Vaping Use   Vaping Use: Never used  Substance and Sexual Activity   Alcohol use: Yes    Comment: ocassional   Drug use: No   Sexual activity: Not on file  Other Topics Concern   Not on file  Social History Narrative   Not on file   Social Determinants of Health   Financial Resource Strain: Not on file  Food Insecurity: Not on file  Transportation Needs: Not on file  Physical Activity: Not on file  Stress: Not on file  Social Connections: Not on file        Objective:  Physical Exam: BP (!) 136/97   Pulse 78   Temp 98 F (36.7 C) (Temporal)   Ht 5\' 10"  (1.778 m)   Wt 250 lb 9.6 oz (113.7 kg)   SpO2 98%   BMI 35.96 kg/m   Body mass index is 35.96 kg/m. Wt Readings from Last 3 Encounters:  01/26/21 250 lb 9.6 oz (113.7 kg)  11/02/20 252 lb 3.2 oz (114.4 kg)  07/02/20 250 lb 6.4 oz (113.6 kg)   Gen: NAD, resting comfortably HEENT: TMs normal bilaterally. OP clear. No thyromegaly noted.  CV: RRR with no murmurs  appreciated Pulm: NWOB, CTAB with no crackles, wheezes, or rhonchi GI: Normal bowel sounds present. Soft, Nontender, Nondistended. MSK: no edema, cyanosis, or clubbing noted Skin: warm, dry Neuro: CN2-12 grossly intact. Strength 5/5 in upper and lower extremities. Reflexes symmetric and intact bilaterally.  Psych: Normal affect and thought content      I,Savera Zaman,acting as a scribe for 07/04/20, MD.,have documented all relevant documentation on the behalf of Jacquiline Doe, MD,as directed by  Jacquiline Doe, MD while in the presence of Jacquiline Doe, MD.   I, Jacquiline Doe, MD, have reviewed all documentation for this visit. The documentation on 01/26/21 for the exam, diagnosis, procedures, and orders are all accurate and complete.  03/28/21.  Jimmey Ralph, MD 01/26/2021 9:57 AM

## 2021-01-26 NOTE — Assessment & Plan Note (Signed)
No recent flares.  Continue allopurinol daily and meloxicam as needed.  Check uric acid level today.

## 2021-01-26 NOTE — Assessment & Plan Note (Signed)
Follows with pulmonology.  On CPAP.  Likely contributing to his fatigue.

## 2021-01-26 NOTE — Assessment & Plan Note (Signed)
Diastolic elevated today.  He has been out of meds for the last couple of days.  We will check labs today.  Hopefully this will improve as he is treated for his OSA.  Continue home monitoring goal 140/90 or lower.

## 2021-01-27 ENCOUNTER — Other Ambulatory Visit: Payer: Self-pay | Admitting: Family Medicine

## 2021-01-27 ENCOUNTER — Telehealth: Payer: Self-pay

## 2021-01-27 ENCOUNTER — Other Ambulatory Visit: Payer: Self-pay | Admitting: *Deleted

## 2021-01-27 MED ORDER — OZEMPIC (0.25 OR 0.5 MG/DOSE) 2 MG/1.5ML ~~LOC~~ SOPN: 0.2500 mg | PEN_INJECTOR | SUBCUTANEOUS | 0 refills | Status: DC

## 2021-01-27 NOTE — Telephone Encounter (Signed)
Patient is calling in stating insurance is requiring a PA on the Ozempic.

## 2021-01-27 NOTE — Progress Notes (Signed)
Please inform patient of the following:  His blood sugar is borderline diabetic. Recommend starting wegovy or ozempic as we discussed at his office visit. Ok to send in 0.25mg  once weekly. I would like for him to do this for 4 weeks and check in with [place] at that time to discuss increasing the dose.  His cholesterol levels are elevated. I hope this will improve with weight loss. Do not need to start any cholesterol medications at this time.   His number is elevated. We can increase his allopurinol to 300mg  daily. Please send in new rx if needed. We can recheck this in 6-12 months.  Everything els is NORMAL.  8-12. Katina Degree, MD 01/27/2021 8:10 AM

## 2021-01-28 ENCOUNTER — Other Ambulatory Visit: Payer: Self-pay | Admitting: Family Medicine

## 2021-01-28 NOTE — Telephone Encounter (Signed)
PA (Key: [id number]) Ozempic (0.25 or 0.5 MG/DOSE) 2MG /1.5ML pen-injectors Send today  Waiting for determination

## 2021-01-29 ENCOUNTER — Other Ambulatory Visit: Payer: Self-pay | Admitting: *Deleted

## 2021-01-29 MED ORDER — OZEMPIC (0.25 OR 0.5 MG/DOSE) 2 MG/1.5ML ~~LOC~~ SOPN: 0.2500 mg | PEN_INJECTOR | SUBCUTANEOUS | 0 refills | Status: DC

## 2021-01-29 NOTE — Telephone Encounter (Signed)
Request Reference Number: [id number]. OZEMPIC INJ 2/1.5ML is approved through 01/28/2022 Pharmacy notified

## 2021-02-24 ENCOUNTER — Other Ambulatory Visit: Payer: Self-pay | Admitting: Family Medicine

## 2021-03-28 ENCOUNTER — Other Ambulatory Visit: Payer: Self-pay | Admitting: Family Medicine

## 2021-04-28 ENCOUNTER — Encounter: Payer: Self-pay | Admitting: Family Medicine

## 2021-04-29 NOTE — Telephone Encounter (Signed)
I agree with him going to the 0.5mg  dose. He should check in with [place] in 1-2 months.  Katina Degree. Jimmey Ralph, MD 04/29/2021 9:44 AM

## 2021-04-29 NOTE — Telephone Encounter (Signed)
See note

## 2021-05-20 ENCOUNTER — Other Ambulatory Visit: Payer: Self-pay | Admitting: Family Medicine

## 2021-05-20 ENCOUNTER — Encounter: Payer: Self-pay | Admitting: Family Medicine

## 2021-05-20 MED ORDER — VALACYCLOVIR HCL 1 G PO TABS: 1000.0000 mg | ORAL_TABLET | Freq: Every day | ORAL | 0 refills | Status: DC

## 2021-07-01 ENCOUNTER — Encounter: Payer: Self-pay | Admitting: Family Medicine

## 2021-07-01 ENCOUNTER — Ambulatory Visit: Payer: 59 | Admitting: Family Medicine

## 2021-07-01 VITALS — BP 145/104 | HR 73 | Temp 98.1°F | Ht 70.0 in | Wt 238.6 lb

## 2021-07-01 DIAGNOSIS — H93A9 Pulsatile tinnitus, unspecified ear: Secondary | ICD-10-CM

## 2021-07-01 DIAGNOSIS — I10 Essential (primary) hypertension: Secondary | ICD-10-CM | POA: Diagnosis not present

## 2021-07-01 DIAGNOSIS — I159 Secondary hypertension, unspecified: Secondary | ICD-10-CM

## 2021-07-01 MED ORDER — SEMAGLUTIDE (1 MG/DOSE) 4 MG/3ML ~~LOC~~ SOPN: 1.0000 mg | PEN_INJECTOR | SUBCUTANEOUS | 5 refills | Status: DC

## 2021-07-01 NOTE — Progress Notes (Signed)
? ?  Jose James is a 41 y.o. male who presents today for an office visit. ? ?Assessment/Plan:  ?Chronic Problems Addressed Today: ?Pulsatile tinnitus ?Still having bothersome symptoms that seem to be progressing.  Reassuring exam today without any obvious deficits.  He did have an ENT evaluation a couple of years ago but does not seem like any imaging was done.  We will check MRI to rule out any possible vascular or other causes.  He will likely need to follow back up with ENT depending on results. ? ?Morbid obesity (HCC) ?Is down about 12 pounds since last visit.  We will increase Ozempic to 1mg  weekly.  We will follow-up in a few weeks via MyChart. ? ?Hypertension ?Slightly elevated today though patient has not taken blood pressure meds today.  We will continue current dose losartan-HCTZ 100 - 12.5.  Continue home monitoring goal 140/90 or lower. ? ? ?  ?Subjective:  ?HPI: ? ?Patient here to follow up on pulsatile tinnitus. He has been heartbeat in his left ear. This has been going on for couple of years year or so. He notes symptoms get worse when laying down at night. He also noticed increased beat whenever his heart rate goes up. He is concern this might be cancer. He notes he had mole on left side since he was kid. He has noticed some itching in that area. He had not changed anything other than shampoo. He saw ENT in the past.  No weakness or numbness. No reported vision changes.  ? ? ?He is trying to lose weight. He is down about 12 pounds since last visit. He is currently on Ozempic for the past 2 months. This has been helping with weight loss. He is tolerating his medication. No side effects.  He would like to try higher Ozempic dose.  ? ? ?   ?  ?Objective:  ?Physical Exam: ?BP (!) 145/104 (BP Location: Left Arm)   Pulse 73   Temp 98.1 ?F (36.7 ?C) (Temporal)   Ht 5\' 10"  (1.778 m)   Wt 238 lb 9.6 oz (108.2 kg)   SpO2 98%   BMI 34.24 kg/m?   ?Gen: No acute distress, resting comfortably ?HEENT: TMs  clear bilaterally. ?CV: Regular rate and rhythm with no murmurs appreciated.  No carotid bruits. ?Pulm: Normal work of breathing, clear to auscultation bilaterally with no crackles, wheezes, or rhonchi ?Neuro: Grossly normal, moves all extremities ?Psych: Normal affect and thought content ? ?   ? ?I,Savera Zaman,acting as a for , MD.,have documented all relevant documentation on the behalf of Neurosurgeon, MD,as directed by  Jacquiline Doe, MD while in the presence of Jacquiline Doe, MD.  ? ?I, Jacquiline Doe, MD, have reviewed all documentation for this visit. The documentation on 07/01/21 for the exam, diagnosis, procedures, and orders are all accurate and complete. ? ?Jacquiline Doe. 07/03/21, MD ?07/01/2021 9:03 AM  ? ?

## 2021-07-01 NOTE — Assessment & Plan Note (Signed)
Slightly elevated today though patient has not taken blood pressure meds today.  We will continue current dose losartan-HCTZ 100 - 12.5.  Continue home monitoring goal 140/90 or lower. ?

## 2021-07-01 NOTE — Assessment & Plan Note (Signed)
Is down about 12 pounds since last visit.  We will increase Ozempic to 1mg  weekly.  We will follow-up in a few weeks via MyChart. ?

## 2021-07-01 NOTE — Assessment & Plan Note (Signed)
Still having bothersome symptoms that seem to be progressing.  Reassuring exam today without any obvious deficits.  He did have an ENT evaluation a couple of years ago but does not seem like any imaging was done.  We will check MRI to rule out any possible vascular or other causes.  He will likely need to follow back up with ENT depending on results. ?

## 2021-07-01 NOTE — Patient Instructions (Signed)
It was very nice to see you today! ? ?We will order a MRA of your head to rule out any concerning causes for your pulsatile tinnitus. ? ?We may need to have you follow back up with ENT depending on the results. ? ?Please increase your Ozempic to 1 mg weekly.  Sending message in a few weeks to let me know how you are doing with this. ? ?Take care, ?Dr [name] ? ?PLEASE NOTE: ? ?If you had any lab tests please let us know if you have not heard back within a few days. You may see your results on mychart before we have a chance to review them but we will give you a call once they are reviewed by [place]. If we ordered any referrals today, please let us know if you have not heard from their office within the next week.  ? ?Please try these tips to maintain a healthy lifestyle: ? ?Eat at least 3 REAL meals and 1-2 snacks per day.  Aim for no more than 5 hours between eating.  If you eat breakfast, please do so within one hour of getting up.  ? ?Each meal should contain half fruits/vegetables, one quarter protein, and one quarter carbs (no bigger than a computer mouse) ? ?Cut down on sweet beverages. This includes juice, soda, and sweet tea.  ? ?Drink at least 1 glass of water with each meal and aim for at least 8 glasses per day ? ?Exercise at least 150 minutes every week.   ?

## 2021-07-15 ENCOUNTER — Encounter: Payer: Self-pay | Admitting: Family Medicine

## 2021-07-20 ENCOUNTER — Ambulatory Visit
Admission: RE | Admit: 2021-07-20 | Discharge: 2021-07-20 | Disposition: A | Discharge status: Home / Self Care | Payer: 59 | Source: Ambulatory Visit | Attending: Family Medicine | Admitting: Family Medicine

## 2021-07-20 DIAGNOSIS — H93A9 Pulsatile tinnitus, unspecified ear: Secondary | ICD-10-CM

## 2021-07-22 NOTE — Progress Notes (Signed)
Please inform patient of the following: ? ?Good news! His MRI did show any abnormalities. Do not need to do any further testing at this point. Recommend he follow back up with ENT to discuss further management for his tinnitus if he wishes.

## 2021-07-26 ENCOUNTER — Other Ambulatory Visit: Payer: Self-pay

## 2021-07-26 ENCOUNTER — Encounter: Payer: Self-pay | Admitting: Family Medicine

## 2021-07-26 DIAGNOSIS — H93A9 Pulsatile tinnitus, unspecified ear: Secondary | ICD-10-CM

## 2021-07-26 NOTE — Telephone Encounter (Signed)
Ok to place another order for different provider ?

## 2021-07-26 NOTE — Telephone Encounter (Signed)
Ok to place referral. ? ?Katina Degree. Jimmey Ralph, MD ?07/26/2021 12:19 PM  ? ?

## 2021-07-26 NOTE — Telephone Encounter (Signed)
ENT Referral placed and patient contacted via MyChart msg to advise. ?

## 2021-08-12 ENCOUNTER — Other Ambulatory Visit: Payer: Self-pay | Admitting: Family Medicine

## 2021-10-04 ENCOUNTER — Other Ambulatory Visit: Payer: Self-pay | Admitting: *Deleted

## 2021-10-04 MED ORDER — MELOXICAM 15 MG PO TABS: ORAL_TABLET | ORAL | 1 refills | Status: DC

## 2022-01-03 ENCOUNTER — Telehealth: Payer: Self-pay | Admitting: *Deleted

## 2022-01-03 NOTE — Telephone Encounter (Signed)
(  Key: [id number]) Ozempic (0.25 or 0.5 MG/DOSE) 2MG /1.5ML pen-injectors Waiting for determination

## 2022-01-07 NOTE — Telephone Encounter (Signed)
Request Reference Number: [id number]. OZEMPIC INJ 2/1.5ML is approved through 01/04/2023.

## 2022-01-10 ENCOUNTER — Encounter: Payer: Self-pay | Admitting: *Deleted
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
## 2022-01-28 ENCOUNTER — Ambulatory Visit (INDEPENDENT_AMBULATORY_CARE_PROVIDER_SITE_OTHER): Payer: 59 | Admitting: Family Medicine

## 2022-01-28 ENCOUNTER — Encounter: Payer: Self-pay | Admitting: Family Medicine

## 2022-01-28 ENCOUNTER — Encounter: Payer: Self-pay | Admitting: Internal Medicine

## 2022-01-28 ENCOUNTER — Ambulatory Visit (INDEPENDENT_AMBULATORY_CARE_PROVIDER_SITE_OTHER): Payer: 59 | Admitting: Internal Medicine

## 2022-01-28 VITALS — BP 126/88 | HR 71 | Ht 70.0 in | Wt 244.4 lb

## 2022-01-28 VITALS — BP 133/91 | HR 57 | Temp 97.5°F | Ht 70.0 in | Wt 241.6 lb

## 2022-01-28 DIAGNOSIS — Z13220 Encounter for screening for lipoid disorders: Secondary | ICD-10-CM

## 2022-01-28 DIAGNOSIS — I159 Secondary hypertension, unspecified: Secondary | ICD-10-CM | POA: Diagnosis not present

## 2022-01-28 DIAGNOSIS — R739 Hyperglycemia, unspecified: Secondary | ICD-10-CM | POA: Diagnosis not present

## 2022-01-28 DIAGNOSIS — M109 Gout, unspecified: Secondary | ICD-10-CM | POA: Diagnosis not present

## 2022-01-28 DIAGNOSIS — Z125 Encounter for screening for malignant neoplasm of prostate: Secondary | ICD-10-CM | POA: Diagnosis not present

## 2022-01-28 DIAGNOSIS — Z1159 Encounter for screening for other viral diseases: Secondary | ICD-10-CM

## 2022-01-28 DIAGNOSIS — Z1211 Encounter for screening for malignant neoplasm of colon: Secondary | ICD-10-CM

## 2022-01-28 DIAGNOSIS — R109 Unspecified abdominal pain: Secondary | ICD-10-CM | POA: Diagnosis not present

## 2022-01-28 DIAGNOSIS — Z0001 Encounter for general adult medical examination with abnormal findings: Secondary | ICD-10-CM

## 2022-01-28 DIAGNOSIS — G4733 Obstructive sleep apnea (adult) (pediatric): Secondary | ICD-10-CM | POA: Diagnosis not present

## 2022-01-28 LAB — COMPREHENSIVE METABOLIC PANEL
ALT: 41 U/L (ref 0–53)
AST: 24 U/L (ref 0–37)
Albumin: 4.4 g/dL (ref 3.5–5.2)
Alkaline Phosphatase: 48 U/L (ref 39–117)
BUN: 18 mg/dL (ref 6–23)
CO2: 28 mEq/L (ref 19–32)
Calcium: 9.4 mg/dL (ref 8.4–10.5)
Chloride: 102 mEq/L (ref 96–112)
Creatinine, Ser: 1.39 mg/dL (ref 0.40–1.50)
GFR: 62.62 mL/min (ref >60.00)
Glucose, Bld: 109 mg/dL — ABNORMAL HIGH (ref 70–99)
Potassium: 4.3 mEq/L (ref 3.5–5.1)
Sodium: 138 mEq/L (ref 135–145)
Total Bilirubin: 1 mg/dL (ref 0.2–1.2)
Total Protein: 6.5 g/dL (ref 6.0–8.3)

## 2022-01-28 LAB — CBC
HCT: 44.1 % (ref 39.0–52.0)
Hemoglobin: 15 g/dL (ref 13.0–17.0)
MCHC: 34 g/dL (ref 30.0–36.0)
MCV: 88.2 fl (ref 78.0–100.0)
Platelets: 238 10*3/uL (ref 150.0–400.0)
RBC: 5 Mil/uL (ref 4.22–5.81)
RDW: 14.4 % (ref 11.5–15.5)
WBC: 6.8 10*3/uL (ref 4.0–10.5)

## 2022-01-28 LAB — LIPID PANEL
Cholesterol: 227 mg/dL — ABNORMAL HIGH (ref 0–200)
HDL: 34.8 mg/dL — ABNORMAL LOW (ref >39.00)
NonHDL: 192.32
Total CHOL/HDL Ratio: 7
Triglycerides: 305 mg/dL — ABNORMAL HIGH (ref 0.0–149.0)
VLDL: 61 mg/dL — ABNORMAL HIGH (ref 0.0–40.0)

## 2022-01-28 LAB — URINALYSIS, ROUTINE W REFLEX MICROSCOPIC
Bilirubin Urine: NEGATIVE
Hgb urine dipstick: NEGATIVE
Ketones, ur: NEGATIVE
Leukocytes,Ua: NEGATIVE
Nitrite: NEGATIVE
Specific Gravity, Urine: 1.015 (ref 1.000–1.030)
Total Protein, Urine: NEGATIVE
Urine Glucose: NEGATIVE
Urobilinogen, UA: 0.2 (ref 0.0–1.0)
pH: 6.5 (ref 5.0–8.0)

## 2022-01-28 LAB — LDL CHOLESTEROL, DIRECT: Direct LDL: 141 mg/dL

## 2022-01-28 LAB — PSA: PSA: 0.37 ng/mL (ref 0.10–4.00)

## 2022-01-28 LAB — TSH: TSH: 5.43 u[IU]/mL (ref 0.35–5.50)

## 2022-01-28 LAB — HEMOGLOBIN A1C: Hgb A1c MFr Bld: 6 % (ref 4.6–6.5)

## 2022-01-28 LAB — URIC ACID: Uric Acid, Serum: 7.6 mg/dL (ref 4.0–7.8)

## 2022-01-28 LAB — VITAMIN B12: Vitamin B-12: 177 pg/mL — ABNORMAL LOW (ref 211–911)

## 2022-01-28 MED ORDER — OZEMPIC (0.25 OR 0.5 MG/DOSE) 2 MG/3ML ~~LOC~~ SOPN: PEN_INJECTOR | SUBCUTANEOUS | 1 refills | Status: DC

## 2022-01-28 NOTE — Progress Notes (Signed)
Chief Complaint:  Jose James is a 41 y.o. male who presents today for his annual comprehensive physical exam.    Assessment/Plan:  New/Acute Problems: Left Flank Pain Concern for recurrent nephrolithiasis.  No red flag signs or symptoms.  We will check labs today and UA.  Depending on results may need to get a CT scan to evaluate for nephrolithiasis.  We will follow-up in a couple weeks via [place].  Chronic Problems Addressed Today: Morbid obesity ([place]) He has previously done well with Ozempic however stopped several weeks ago due to concern for side effects that he had read on the news.  He had been tolerating well without any significant side effects personally.  We discussed with patient that gastroparesis is very uncommon and is reversible upon stopping the medication.  He is agreeable to start back.  We will go back with 0.25 mg weekly for 4 weeks and then titrate the dose as needed.  Gout No recent flares.  Check uric acid level.  We will continue allopurinol 100 mg daily.  Hypertension Slightly elevated today though typically well controlled.  Continue current dose losartan-HCTZ 100-12.5 once daily.  Check labs today.   Preventative Healthcare: Check Labs.  Up-to-date on vaccines.  We will check PSA and FOBT to screen for colon cancer per patient request.  Patient Counseling(The following topics were reviewed and/or handout was given):  -Nutrition: Stressed importance of moderation in sodium/caffeine intake, saturated fat and cholesterol, caloric balance, sufficient intake of fresh fruits, vegetables, and fiber.  -Stressed the importance of regular exercise.   -Substance Abuse: Discussed cessation/primary prevention of tobacco, alcohol, or other drug use; driving or other dangerous activities under the influence; availability of treatment for abuse.   -Injury prevention: Discussed safety belts, safety helmets, smoke detector, smoking near bedding or upholstery.    -Sexuality: Discussed sexually transmitted diseases, partner selection, use of condoms, avoidance of unintended pregnancy and contraceptive alternatives.   -Dental health: Discussed importance of regular tooth brushing, flossing, and dental visits.  -Health maintenance and immunizations reviewed. Please refer to Health maintenance section.  Return to care in 1 year for next preventative visit.     Subjective:  HPI:  He has no acute complaints today.   He has had a pain/pressure sensation in his left flank. Worse at night when laying on that side. This has been going on for several years. No nausea or vomiting. He did have issues with kidney stones in the past. He has not noticed   Lifestyle Diet: Trying to cut down on portions.      01/28/2022    7:58 AM  Depression screen PHQ 2/9  Decreased Interest 0  Down, Depressed, Hopeless 0  PHQ - 2 Score 0    There are no preventive care reminders to display for this patient.   ROS: Per HPI, otherwise a complete review of systems was negative.   PMH:  The following were reviewed and entered/updated in epic: Past Medical History:  Diagnosis Date   Allergy    GERD (gastroesophageal reflux disease)    Gout    High cholesterol    Hypertension    Kidney stone    Patient Active Problem List   Diagnosis Date Noted   Morbid obesity ([place]) 01/26/2021   OSA on CPAP 11/02/2020   Shift work sleep disorder 11/02/2020   GERD (gastroesophageal reflux disease)    Pulsatile tinnitus 07/02/2020   Cold sore 07/02/2020   Gout 07/02/2020   Hypertension 12/30/2013   Past  Surgical History:  Procedure Laterality Date   TONSILLECTOMY      Family History  Problem Relation Age of Onset   Diabetes Maternal Grandfather    Hypertension Paternal Grandfather     Medications- reviewed and updated Current Outpatient Medications  Medication Sig Dispense Refill   allopurinol (ZYLOPRIM) 100 MG tablet TAKE 1 TABLET(100 MG) BY MOUTH DAILY 90 tablet  3   losartan-hydrochlorothiazide (HYZAAR) 100-12.5 MG tablet TAKE 1 TABLET BY MOUTH DAILY 90 tablet 3   meloxicam (MOBIC) 15 MG tablet TAKE 1 TABLET(15 MG) BY MOUTH DAILY AS NEEDED 30 tablet 1   Semaglutide, 1 MG/DOSE, 4 MG/3ML SOPN Inject 1 mg as directed once a week. 3 mL 5   Semaglutide,0.25 or 0.5MG /DOS, (OZEMPIC, 0.25 OR 0.5 MG/DOSE,) 2 MG/3ML SOPN Take 0.25mg  weekly for 4 weeks then increase to 0.5mg  weekly 3 mL 1   valACYclovir (VALTREX) 1000 MG tablet Take 1 tablet (1,000 mg total) by mouth daily. 90 tablet 0   No current facility-administered medications for this visit.    Allergies-reviewed and updated Allergies  Allergen Reactions   Penicillins Other (See Comments)    Childhood allergy    Social History   Socioeconomic History   Marital status: Divorced    Spouse name: Not on file   Number of children: Not on file   Years of education: Not on file   Highest education level: Not on file  Occupational History   Not on file  Tobacco Use   Smoking status: Never   Smokeless tobacco: Not on file  Vaping Use   Vaping Use: Never used  Substance and Sexual Activity   Alcohol use: Yes    Comment: ocassional   Drug use: No   Sexual activity: Not on file  Other Topics Concern   Not on file  Social History Narrative   Not on file   Social Determinants of Health   Financial Resource Strain: Not on file  Food Insecurity: Not on file  Transportation Needs: Not on file  Physical Activity: Not on file  Stress: Not on file  Social Connections: Not on file        Objective:  Physical Exam: BP (!) 133/91 (BP Location: Left Arm, Patient Position: Sitting)   Pulse (!) 57   Temp (!) 97.5 F (36.4 C) (Temporal)   Ht 5\' 10"  (1.778 m)   Wt 241 lb 9.6 oz (109.6 kg)   SpO2 96%   BMI 34.67 kg/m   Body mass index is 34.67 kg/m. Wt Readings from Last 3 Encounters:  01/28/22 241 lb 9.6 oz (109.6 kg)  07/01/21 238 lb 9.6 oz (108.2 kg)  01/26/21 250 lb 9.6 oz (113.7 kg)    Gen: NAD, resting comfortably HEENT: TMs normal bilaterally. OP clear. No thyromegaly noted.  CV: RRR with no murmurs appreciated Pulm: NWOB, CTAB with no crackles, wheezes, or rhonchi GI: Normal bowel sounds present. Soft, Nontender, Nondistended. MSK: no edema, cyanosis, or clubbing noted Skin: warm, dry Neuro: CN2-12 grossly intact. Strength 5/5 in upper and lower extremities. Reflexes symmetric and intact bilaterally.  Psych: Normal affect and thought content     Isais Klipfel M. 03/28/21, MD 01/28/2022 8:37 AM

## 2022-01-28 NOTE — Assessment & Plan Note (Signed)
No recent flares.  Check uric acid level.  We will continue allopurinol 100 mg daily.

## 2022-01-28 NOTE — Assessment & Plan Note (Signed)
He has previously done well with Ozempic however stopped several weeks ago due to concern for side effects that he had read on the news.  He had been tolerating well without any significant side effects personally.  We discussed with patient that gastroparesis is very uncommon and is reversible upon stopping the medication.  He is agreeable to start back.  We will go back with 0.25 mg weekly for 4 weeks and then titrate the dose as needed.

## 2022-01-28 NOTE — Patient Instructions (Addendum)
Order- DME [facility]- continue CPAP auto 4-20, mask of choice, humidifier, supplies, [employer]/ card.   Please refit mask of choice.  You can look into getting a spare machine.  Travel CPAP machines like AirMini or Transcend are available at on-line sources like [employer]. You want an autopap machine.   You can try otc nasal saline gel (any brand) for dry nose if you aren't using the humidifier.

## 2022-01-28 NOTE — Assessment & Plan Note (Addendum)
Slightly elevated today though typically well controlled.  Continue current dose losartan-HCTZ 100-12.5 once daily.  Check labs today.

## 2022-01-28 NOTE — Patient Instructions (Signed)
It was very nice to see you today!  We will check blood work.  We will check a urine sample.  Please start back on the Ozempic.  We may need to get a CT scan depending on the results of your urine sample.  Please follow-up with me in a few weeks limit how you are doing.  I will see you back in year for your next physical.  Come back sooner if needed.  Take care, Dr [name]  PLEASE NOTE:  If you had any lab tests please let us know if you have not heard back within a few days. You may see your results on mychart before we have a chance to review them but we will give you a call once they are reviewed by [place]. If we ordered any referrals today, please let us know if you have not heard from their office within the next week.   Please try these tips to maintain a healthy lifestyle:  Eat at least 3 REAL meals and 1-2 snacks per day.  Aim for no more than 5 hours between eating.  If you eat breakfast, please do so within one hour of getting up.   Each meal should contain half fruits/vegetables, one quarter protein, and one quarter carbs (no bigger than a computer mouse)  Cut down on sweet beverages. This includes juice, soda, and sweet tea.   Drink at least 1 glass of water with each meal and aim for at least 8 glasses per day  Exercise at least 150 minutes every week.    Preventive Care 42-38 Years Old, Male Preventive care refers to lifestyle choices and visits with your health care provider that can promote health and wellness. Preventive care visits are also called wellness exams. What can I expect for my preventive care visit? Counseling During your preventive care visit, your health care provider may ask about your: Medical history, including: Past medical problems. Family medical history. Current health, including: Emotional well-being. Home life and relationship well-being. Sexual activity. Lifestyle, including: Alcohol, nicotine or tobacco, and drug use. Access to  firearms. Diet, exercise, and sleep habits. Safety issues such as seatbelt and bike helmet use. Sunscreen use. Work and work Statistician. Physical exam Your health care provider will check your: Height and weight. These may be used to calculate your BMI (body mass index). BMI is a measurement that tells if you are at a healthy weight. Waist circumference. This measures the distance around your waistline. This measurement also tells if you are at a healthy weight and may help predict your risk of certain diseases, such as type 2 diabetes and high blood pressure. Heart rate and blood pressure. Body temperature. Skin for abnormal spots. What immunizations do I need?  Vaccines are usually given at various ages, according to a schedule. Your health care provider will recommend vaccines for you based on your age, medical history, and lifestyle or other factors, such as travel or where you work. What tests do I need? Screening Your health care provider may recommend screening tests for certain conditions. This may include: Lipid and cholesterol levels. Diabetes screening. This is done by checking your blood sugar (glucose) after you have not eaten for a while (fasting). Hepatitis B test. Hepatitis C test. HIV (human immunodeficiency virus) test. STI (sexually transmitted infection) testing, if you are at risk. Lung cancer screening. Prostate cancer screening. Colorectal cancer screening. Talk with your health care provider about your test results, treatment options, and if necessary, the need for  more tests. Follow these instructions at home: Eating and drinking  Eat a diet that includes fresh fruits and vegetables, whole grains, lean protein, and low-fat dairy products. Take vitamin and mineral supplements as recommended by your health care provider. Do not drink alcohol if your health care provider tells you not to drink. If you drink alcohol: Limit how much you have to 0-2 drinks a  day. Know how much alcohol is in your drink. In the [place] one drink equals one 12 oz bottle of beer (355 mL), one 5 oz glass of wine (148 mL), or one 1 oz glass of hard liquor (44 mL). Lifestyle Brush your teeth every morning and night with fluoride toothpaste. Floss one time each day. Exercise for at least 30 minutes 5 or more days each week. Do not use any products that contain nicotine or tobacco. These products include cigarettes, chewing tobacco, and vaping devices, such as e-cigarettes. If you need help quitting, ask your health care provider. Do not use drugs. If you are sexually active, practice safe sex. Use a condom or other form of protection to prevent STIs. Take aspirin only as told by your health care provider. Make sure that you understand how much to take and what form to take. Work with your health care provider to find out whether it is safe and beneficial for you to take aspirin daily. Find healthy ways to manage stress, such as: Meditation, yoga, or listening to music. Journaling. Talking to a trusted person. Spending time with friends and family. Minimize exposure to UV radiation to reduce your risk of skin cancer. Safety Always wear your seat belt while driving or riding in a vehicle. Do not drive: If you have been drinking alcohol. Do not ride with someone who has been drinking. When you are tired or distracted. While texting. If you have been using any mind-altering substances or drugs. Wear a helmet and other protective equipment during sports activities. If you have firearms in your house, make sure you follow all gun safety procedures. What's next? Go to your health care provider once a year for an annual wellness visit. Ask your health care provider how often you should have your eyes and teeth checked. Stay up to date on all vaccines. This information is not intended to replace advice given to you by your health care provider. Make sure you discuss any  questions you have with your health care provider. Document Revised: 09/30/2020 Document Reviewed: 09/30/2020 Elsevier Patient Education  [place].

## 2022-01-31 NOTE — Progress Notes (Signed)
Please inform patient of the following:  Cholesterol and blood sugar both borderline elevated but better than last year.  He should continue to work on diet and exercise and we can recheck these in a year.  His B12 is low.  Recommend he take 1000 mcg daily.  We should recheck this again in 3 to 6 months.  The rest of his labs are all normal and we can recheck in a year.

## 2022-02-01 ENCOUNTER — Other Ambulatory Visit: Payer: Self-pay

## 2022-02-01 DIAGNOSIS — E538 Deficiency of other specified B group vitamins: Secondary | ICD-10-CM

## 2022-02-02 ENCOUNTER — Encounter: Payer: Self-pay | Admitting: Internal Medicine

## 2022-02-02 NOTE — Assessment & Plan Note (Signed)
Hopefully he can continue weight loss.  Ozempic seems to be making a difference.

## 2022-02-02 NOTE — Assessment & Plan Note (Addendum)
Benefits from CPAP with good compliance and control.  Comfort measures emphasized.  Discussed ways to deal with nasal dryness.  Discussed availability of travel CPAP machines. Plan-nasal saline gel, adjust humidifier setting.  Continue auto 4-20

## 2022-02-05 ENCOUNTER — Other Ambulatory Visit: Payer: Self-pay | Admitting: Family Medicine

## 2022-03-03 ENCOUNTER — Telehealth: Payer: Self-pay | Admitting: Family Medicine

## 2022-03-03 NOTE — Telephone Encounter (Signed)
Patient states he left his stool sample kit at home and will not be back in town until 03/07/22.  Patient requests to be called at ph# [phone] to be advised if he should come to office to pick up another sample kit or will the sample he already did still be good until 03/07/22.

## 2022-03-03 NOTE — Telephone Encounter (Signed)
Please advise and I will contact pt

## 2022-03-03 NOTE — Telephone Encounter (Signed)
The kit he has will still be good as long as it has been unused.  Jose James. [name], MD 03/03/2022 12:38 PM

## 2022-03-03 NOTE — Telephone Encounter (Signed)
I called pt and pt stated he will be able to pick up another stool sample kit due to him using the other but leaving it at home. I Let Joellen Jersey know pt will be picking up stool sample kit today.

## 2022-03-07 ENCOUNTER — Other Ambulatory Visit (INDEPENDENT_AMBULATORY_CARE_PROVIDER_SITE_OTHER): Payer: 59

## 2022-03-07 DIAGNOSIS — Z0001 Encounter for general adult medical examination with abnormal findings: Secondary | ICD-10-CM | POA: Diagnosis not present

## 2022-03-07 LAB — FECAL OCCULT BLOOD, IMMUNOCHEMICAL: Fecal Occult Bld: NEGATIVE

## 2022-03-07 NOTE — Progress Notes (Signed)
Please inform patient of the following:  Great news! His stool sample was normal.  We can recheck in a year.

## 2022-04-06 ENCOUNTER — Other Ambulatory Visit: Payer: Self-pay | Admitting: Family Medicine

## 2022-04-20 ENCOUNTER — Telehealth: Payer: Self-pay | Admitting: Family Medicine

## 2022-04-20 ENCOUNTER — Encounter: Payer: Self-pay | Admitting: Family Medicine

## 2022-04-20 NOTE — Telephone Encounter (Signed)
Please advise 

## 2022-04-20 NOTE — Telephone Encounter (Signed)
Patient states: -Currently is having a gout flare up  - Has been taking allopurinol as directed  Patient requests: -PCP send in a medication to help with this to [facility] at [address] [place] [address]

## 2022-04-21 ENCOUNTER — Other Ambulatory Visit: Payer: Self-pay | Admitting: *Deleted

## 2022-04-21 NOTE — Telephone Encounter (Signed)
Please have him schedule an appointment ASAP.  Jose James. [name], MD 04/21/2022 2:58 PM

## 2022-04-21 NOTE — Telephone Encounter (Signed)
Ok to send in new rx for ozempic 0.5mg  weekly. Would like for him to follow up with [place] in a few weeks after starting the higher dose.  Algis Greenhouse. [name], MD 04/21/2022 2:47 PM

## 2022-04-22 ENCOUNTER — Other Ambulatory Visit: Payer: Self-pay | Admitting: *Deleted

## 2022-04-22 MED ORDER — OZEMPIC (0.25 OR 0.5 MG/DOSE) 2 MG/3ML ~~LOC~~ SOPN: 0.5000 mg | PEN_INJECTOR | SUBCUTANEOUS | 1 refills | Status: DC

## 2022-04-22 NOTE — Telephone Encounter (Signed)
Informed pt of PCP message. Patient states he was hurting a couple of days ago but the flare up has resolved. I offered patient earliest OV on 04/26/22 @ 11:20am but he declined. States he will call back if he changes his mind.

## 2022-05-08 ENCOUNTER — Encounter: Payer: Self-pay | Admitting: Family Medicine

## 2022-05-09 ENCOUNTER — Ambulatory Visit: Payer: 59 | Admitting: Family

## 2022-05-09 ENCOUNTER — Emergency Department (HOSPITAL_BASED_OUTPATIENT_CLINIC_OR_DEPARTMENT_OTHER): Payer: 59

## 2022-05-09 ENCOUNTER — Encounter (HOSPITAL_BASED_OUTPATIENT_CLINIC_OR_DEPARTMENT_OTHER): Payer: Self-pay

## 2022-05-09 ENCOUNTER — Emergency Department (HOSPITAL_BASED_OUTPATIENT_CLINIC_OR_DEPARTMENT_OTHER)
Admission: EM | Admit: 2022-05-09 | Discharge: 2022-05-09 | Disposition: A | Discharge status: Home / Self Care | Payer: 59 | Attending: Emergency Medicine | Admitting: Emergency Medicine

## 2022-05-09 ENCOUNTER — Ambulatory Visit: Payer: 59 | Admitting: Physician Assistant

## 2022-05-09 ENCOUNTER — Other Ambulatory Visit: Payer: Self-pay

## 2022-05-09 ENCOUNTER — Encounter: Payer: Self-pay | Admitting: Physician Assistant

## 2022-05-09 ENCOUNTER — Emergency Department (HOSPITAL_COMMUNITY): Payer: 59

## 2022-05-09 VITALS — BP 140/92 | HR 77 | Temp 98.0°F | Ht 70.0 in | Wt 229.0 lb

## 2022-05-09 DIAGNOSIS — R42 Dizziness and giddiness: Secondary | ICD-10-CM

## 2022-05-09 DIAGNOSIS — I639 Cerebral infarction, unspecified: Secondary | ICD-10-CM | POA: Insufficient documentation

## 2022-05-09 DIAGNOSIS — R1110 Vomiting, unspecified: Secondary | ICD-10-CM | POA: Insufficient documentation

## 2022-05-09 DIAGNOSIS — I10 Essential (primary) hypertension: Secondary | ICD-10-CM

## 2022-05-09 DIAGNOSIS — I159 Secondary hypertension, unspecified: Secondary | ICD-10-CM

## 2022-05-09 LAB — BASIC METABOLIC PANEL
Anion gap: 9 (ref 5–15)
BUN: 19 mg/dL (ref 6–20)
CO2: 28 mmol/L (ref 22–32)
Calcium: 10 mg/dL (ref 8.9–10.3)
Chloride: 102 mmol/L (ref 98–111)
Creatinine, Ser: 1.57 mg/dL — ABNORMAL HIGH (ref 0.61–1.24)
GFR, Estimated: 56 mL/min — ABNORMAL LOW (ref >60)
Glucose, Bld: 105 mg/dL — ABNORMAL HIGH (ref 70–99)
Potassium: 3.9 mmol/L (ref 3.5–5.1)
Sodium: 139 mmol/L (ref 135–145)

## 2022-05-09 LAB — CBC
HCT: 49.5 % (ref 39.0–52.0)
Hemoglobin: 16.8 g/dL (ref 13.0–17.0)
MCH: 29.6 pg (ref 26.0–34.0)
MCHC: 33.9 g/dL (ref 30.0–36.0)
MCV: 87.3 fL (ref 80.0–100.0)
Platelets: 287 10*3/uL (ref 150–400)
RBC: 5.67 MIL/uL (ref 4.22–5.81)
RDW: 13.7 % (ref 11.5–15.5)
WBC: 9.1 10*3/uL (ref 4.0–10.5)
nRBC: 0 % (ref 0.0–0.2)

## 2022-05-09 LAB — URINALYSIS, ROUTINE W REFLEX MICROSCOPIC
Bilirubin Urine: NEGATIVE
Glucose, UA: NEGATIVE mg/dL
Hgb urine dipstick: NEGATIVE
Ketones, ur: NEGATIVE mg/dL
Leukocytes,Ua: NEGATIVE
Nitrite: NEGATIVE
Protein, ur: NEGATIVE mg/dL
Specific Gravity, Urine: 1.02 (ref 1.005–1.030)
pH: 6.5 (ref 5.0–8.0)

## 2022-05-09 MED ORDER — IOHEXOL 350 MG/ML SOLN
100.0000 mL | Freq: Once | INTRAVENOUS | Status: AC | PRN
  Administered 2022-05-09: 75 mL via INTRAVENOUS

## 2022-05-09 MED ORDER — SODIUM CHLORIDE 0.9 % IV BOLUS
500.0000 mL | Freq: Once | INTRAVENOUS | Status: AC
  Administered 2022-05-09: 500 mL via INTRAVENOUS

## 2022-05-09 MED ORDER — ACETAMINOPHEN 325 MG PO TABS
650.0000 mg | ORAL_TABLET | Freq: Once | ORAL | Status: AC
  Administered 2022-05-09: 650 mg via ORAL
  Filled 2022-05-09: qty 2

## 2022-05-09 NOTE — ED Notes (Signed)
Patient transported to MRI 

## 2022-05-09 NOTE — ED Provider Notes (Signed)
Patient transferred for MRI.  He is reporting some moderate headache so was given some Tylenol.  MRI images are unremarkable including no stroke.  He is little concerned about his blood pressure which is mildly elevated here at 150/98 and he will need this rechecked with his family doctor.  Otherwise I do not think he needs any urgent changes in the emergency department and is stable for discharge.  He is ambulating without difficulty here.   Sherwood Gambler, MD 05/09/22 [phone]

## 2022-05-09 NOTE — Discharge Instructions (Addendum)
Your MRI does not show any evidence of a stroke today.  Your blood pressure is elevated and will need to be rechecked with your family doctor.  Otherwise at this point if you develop severe headache, new or worsening dizziness, vomiting, neck stiffness, fever, or any other new/concerning symptoms and return to the ER for evaluation or call 911.

## 2022-05-09 NOTE — Progress Notes (Addendum)
Jose James is a 42 y.o. male here for a follow up of a pre-existing problem.  History of Present Illness:   Chief Complaint  Patient presents with   Dizziness    Urgent care f/u needs CT scan ordered. Also having nausea, blood pressure elevated. He is currently taking Zofran and Meclizine. Blood pressure 155/110. Pt is not taking Blood pressure regularly.   Patient presents with fiance    Vertigo/Nausea and HTN Friday at 3p was having vertigo (room-spinning); sudden onset Would occur in 10-15 min spells He thought that maybe this was related to his blood pressure so he took his blood pressure medicine that day and consume some water  Next morning, had vomiting and nausea On Saturday night he did a TeleDoc appointment and had a prescription of Zofran and meclizine sent in Dizziness throughout the day Saturday and yesterday He ended up going to an urgent care last night --he had negative COVID and flu test, they said he had positive nystagmus on exam and that he needed a head CT He woke up this morning and had persistent vertigo and is here to further evaluate his symptoms  His fiance reports that he is may be consumed 30 ounces of water all weekend He drinks caffeinated soda regularly Does not take blood pressure medication consistently He also is prescribed Ozempic 0.5 mg weekly but does not take this consistently  Denies: Chest pain, shortness of breath, lower extremity swelling   Past Medical History:  Diagnosis Date   Allergy    GERD (gastroesophageal reflux disease)    Gout    High cholesterol    Hypertension    Kidney stone      Social History   Tobacco Use   Smoking status: Never  Vaping Use   Vaping Use: Never used  Substance Use Topics   Alcohol use: Yes    Comment: ocassional   Drug use: No    Past Surgical History:  Procedure Laterality Date   TONSILLECTOMY      Family History  Problem Relation Age of Onset   Diabetes Maternal Grandfather     Hypertension Paternal Grandfather     Allergies  Allergen Reactions   Penicillins Other (See Comments)    Childhood allergy    Current Medications:   Current Outpatient Medications:    allopurinol (ZYLOPRIM) 100 MG tablet, TAKE 1 TABLET(100 MG) BY MOUTH DAILY, Disp: 90 tablet, Rfl: 3   losartan-hydrochlorothiazide (HYZAAR) 100-12.5 MG tablet, TAKE 1 TABLET BY MOUTH DAILY, Disp: 90 tablet, Rfl: 3   meloxicam (MOBIC) 15 MG tablet, TAKE 1 TABLET(15 MG) BY MOUTH DAILY AS NEEDED, Disp: 30 tablet, Rfl: 1   Semaglutide,0.25 or 0.5MG/DOS, (OZEMPIC, 0.25 OR 0.5 MG/DOSE,) 2 MG/3ML SOPN, Inject 0.5 mg into the skin once a week., Disp: 3 mL, Rfl: 1   valACYclovir (VALTREX) 1000 MG tablet, Take 1 tablet (1,000 mg total) by mouth daily. (Patient taking differently: Take 1,000 mg by mouth as needed.), Disp: 90 tablet, Rfl: 0   Review of Systems:   Review of Systems  Neurological:  Positive for dizziness.   Negative unless otherwise specified per HPI.  Vitals:   Vitals:   05/09/22 1103 05/09/22 1129  BP: (!) 140/100 (!) 140/92  Pulse: 77   Temp: 98 F (36.7 C)   TempSrc: Temporal   SpO2: 98%   Weight: 229 lb (103.9 kg)   Height: 5' 10"$  (1.778 m)      Body mass index is 32.86 kg/m.  Physical Exam:  Physical Exam Vitals and nursing note reviewed.  Constitutional:      General: He is not in acute distress.    Appearance: He is well-developed. He is not ill-appearing or toxic-appearing.  HENT:     Head: Normocephalic and atraumatic.     Right Ear: Ear canal and external ear normal. A middle ear effusion is present. Tympanic membrane is not erythematous, retracted or bulging.     Left Ear: Ear canal and external ear normal. A middle ear effusion is present. Tympanic membrane is not erythematous, retracted or bulging.     Nose: Nose normal.     Right Sinus: No maxillary sinus tenderness or frontal sinus tenderness.     Left Sinus: No maxillary sinus tenderness or frontal sinus  tenderness.     Mouth/Throat:     Pharynx: Uvula midline. No posterior oropharyngeal erythema.  Eyes:     General: Lids are normal.     Conjunctiva/sclera: Conjunctivae normal.  Neck:     Trachea: Trachea normal.  Cardiovascular:     Rate and Rhythm: Normal rate and regular rhythm.     Heart sounds: Normal heart sounds, S1 normal and S2 normal.  Pulmonary:     Effort: Pulmonary effort is normal.     Breath sounds: Normal breath sounds. No decreased breath sounds, wheezing, rhonchi or rales.  Lymphadenopathy:     Cervical: No cervical adenopathy.  Skin:    General: Skin is warm and dry.  Neurological:     General: No focal deficit present.     Mental Status: He is alert.     Cranial Nerves: Cranial nerves 2-12 are intact.     Sensory: Sensation is intact.     Motor: Motor function is intact.     Coordination: Coordination is intact.     Gait: Gait is intact.  Psychiatric:        Speech: Speech normal.        Behavior: Behavior normal. Behavior is cooperative.     Assessment and Plan:   Vertigo He appears quite uncomfortable on exam I was a able to secure a vestibular rehab physical therapy appointment for him tomorrow at 8 AM at [facility] is concerned about his dehydration and I do agree that this is likely exacerbating his symptoms We discussed option of getting blood work, having him drink fluids at home and ordering brain imaging versus going to the emergency room Patient and fianc opted to proceed to the emergency room Will follow along plan  Primary hypertension Uncontrolled He is noncompliant with medication and understands need for becoming compliant Recommend continue losartan hydrochlorothiazide 100-12.5 mg daily Will defer to ER to update kidney function and determine if this medication is appropriate at this time  Time spent with patient today was 35 minutes which consisted of chart review, discussing diagnosis, work up, treatment answering questions  and documentation.   Inda Coke, PA-C

## 2022-05-09 NOTE — ED Notes (Signed)
Charge nurse at [facility] called and made aware that pt was coming over POV for an MRI and would be in the lobby.

## 2022-05-09 NOTE — Therapy (Signed)
OUTPATIENT PHYSICAL THERAPY VESTIBULAR EVALUATION     Patient Name: Jose James MRN: [id number] DOB:1980/12/18, 42 y.o., male Today's Date: 05/10/2022  END OF SESSION:  PT End of Session - 05/10/22 1125     Visit Number 1    Number of Visits 6    Date for PT Re-Evaluation 06/21/22    Authorization Type [employer]    PT Start Time 0805    PT Stop Time 0843    PT Time Calculation (min) 38 min    Activity Tolerance Patient tolerated treatment well    Behavior During Therapy [facility] for tasks assessed/performed             Past Medical History:  Diagnosis Date   Allergy    GERD (gastroesophageal reflux disease)    Gout    High cholesterol    Hypertension    Kidney stone    Past Surgical History:  Procedure Laterality Date   TONSILLECTOMY     Patient Active Problem List   Diagnosis Date Noted   Morbid obesity (HCC) 01/26/2021   OSA on CPAP 11/02/2020   Shift work sleep disorder 11/02/2020   GERD (gastroesophageal reflux disease)    Pulsatile tinnitus 07/02/2020   Cold sore 07/02/2020   Gout 07/02/2020   Hypertension 12/30/2013    PCP: Jacquiline Doe, MD  REFERRING PROVIDER: Jarold Motto, PA  REFERRING DIAG: R42 (ICD-10-CM) - Vertigo  THERAPY DIAG:  Dizziness and giddiness - Plan: PT plan of care cert/re-cert  Unsteadiness on feet - Plan: PT plan of care cert/re-cert  ONSET DATE: 05/06/22  Rationale for Evaluation and Treatment: Rehabilitation  SUBJECTIVE:   SUBJECTIVE STATEMENT: Reports onset of symptoms on 05/06/22, described as sudden onset of spinning when sitting in car.  Symptoms lasted 2-3 min, then about 15 min later started again.  Took BP medication and drank water, with symptoms continuing.  Constant dizziness started Saturday with positive emesis, virtual visit Rx zofran and meclizine.  Then had another episode in the shower feeling like he would pass out.    Pt accompanied by: self  PERTINENT HISTORY: Gout, HTN (uncontrolled), OSA on CPAP,  morbid obesity  PAIN:  Are you having pain? No  PRECAUTIONS: None  WEIGHT BEARING RESTRICTIONS: No  FALLS: Has patient fallen in last 6 months? 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, and Leisure: work  PATIENT GOALS: improved dizziness  OBJECTIVE:   DIAGNOSTIC FINDINGS: MRI negative for CVA  COGNITION: Overall cognitive status: Within functional limits for tasks assessed   SENSATION: WFL  GAIT: 05/10/22: appears WNL, occasional episodes of imbalance when stopping due to vertigo, but pt able to self correct  VESTIBULAR ASSESSMENT:  GENERAL OBSERVATION: no symptoms this morning at rest; hasn't taken meclizine since 05/08/22   SYMPTOM BEHAVIOR:  Subjective history: see above  Non-Vestibular symptoms: nausea/vomiting and heartbeat with fullness in Lt ear  Type of dizziness: Imbalance (Disequilibrium), Spinning/Vertigo, and Unsteady with head/body turns  Frequency: initially constant, now quick movements (getting up too quick, turning head or body too quick)  Duration: none for 24 hours  Aggravating factors: Induced by position change: supine to sit and sit to stand and Induced by motion: occur when walking, turning body quickly, and turning head quickly  Relieving factors: no known relieving factors and rest  Progression of symptoms: better  OCULOMOTOR  EXAM:  Ocular Alignment: normal  Ocular ROM: No Limitations  Spontaneous Nystagmus: absent  Gaze-Induced Nystagmus: right beating with right gaze  Smooth Pursuits: intact  Saccades: intact, slow, and slow to Rt  Convergence/Divergence: Lt eye tracks laterally with convergence at 2 cm  VESTIBULAR - OCULAR REFLEX:   VOR: Comment: unable to maintain gaze, catch up saccades  noted  Head-Impulse Test: HIT Right: 1st test with catch up saccade, 2nd able to maintain gaze HIT Left: positive   POSITIONAL TESTING: Right Dix-Hallpike: upbeating, right nystagmus and possibly due to neuritis - minimal symptoms in this position with nystagmus Left Dix-Hallpike: no nystagmus Right Roll Test: no nystagmus Left Roll Test: no nystagmus  MOTION SENSITIVITY:  Motion Sensitivity Quotient Intensity: 0 = none, 1 = Lightheaded, 2 = Mild, 3 = Moderate, 4 = Severe, 5 = Vomiting  Intensity  1. Sitting to supine   2. Supine to L side   3. Supine to R side   4. Supine to sitting   5. L Hallpike-Dix   6. Up from L    7. R Hallpike-Dix   8. Up from R    9. Sitting, head tipped to L knee   10. Head up from L knee   11. Sitting, head tipped to R knee   12. Head up from R knee   13. Sitting head turns x5   14.Sitting head nods x5   15. In stance, 180 turn to L    16. In stance, 180 turn to R     OTHOSTATICS: not done  BALANCE:  05/10/22: increased sway on complaint surface with EC with feet apart and feet together   VESTIBULAR TREATMENT:                                                                                                   DATE:  05/10/22 Gaze Adaptation: x1 Viewing Horizontal: Position: sitting, Time: 10 sec, Reps: 1, and Comment: unable to maintain gaze and x1 Viewing Vertical:  Position: sitting, Time: 15 sec, Reps: 1, and Comment: no symptoms, intact Habituation: Other: Feet together with EC on compliant surface x 20 sec   PATIENT EDUCATION: Education details: HEP, vestibular neuritis Person educated: Patient Education method: Explanation, Demonstration, and Handouts Education comprehension: verbalized understanding, returned demonstration, and needs further education  HOME EXERCISE PROGRAM: Access Code: [email or web address] URL: [email or web address] Date: 05/10/2022 Prepared by: Faustino Congress  Exercises - Seated Gaze Stabilization with  Head Rotation  - 2-3 x daily - 7 x weekly - 1 sets - 30-60 seconds - Romberg Stance Eyes Closed on Foam Pad  - 2-3 x daily - 7 x weekly - 1 sets - 5 reps - 15-20 sec hold  Patient Education - Gaze Stabilization  GOALS: Goals reviewed with patient? Yes  SHORT TERM GOALS: Target date: 05/31/2022  Independent with initial HEP Goal status: INITIAL  LONG TERM GOALS: Target date: 06/21/2022  Independent with final HEP if indicated Goal status: INITIAL  2.  Demonstrate ability to stand on compliant surface with EC x 30 sec and feet together with minimal sway Goal status:  INITIAL  3.  Perform horizontal VOR and maintain gaze for improved visual input and balance Goal status: INITIAL  4.  Demonstrate negative positional testing (if indicated) Goal status: INITIAL   ASSESSMENT:  CLINICAL IMPRESSION: Patient is a 42 y.o. male who was seen today for physical therapy evaluation and treatment for sudden onset of vertigo 05/06/22 consistent with Lt vestibular neuritis.  Vertigo may be multifactorial as BP is still hypertensive today and he is being closely followed by his PCP, and CVA has been r/o at ED.  He may also have BPPV but will await until gaze induced nystagmus resolves to fully assess/treat.  Will benefit from PT to address deficits listed.   OBJECTIVE IMPAIRMENTS: decreased balance, decreased coordination, decreased mobility, dizziness, and impaired vision/preception.   ACTIVITY LIMITATIONS: carrying, lifting, bending, sitting, standing, transfers, bathing, toileting, dressing, hygiene/grooming, and locomotion level  PARTICIPATION LIMITATIONS: cleaning, laundry, driving, community activity, occupation, and yard work  PERSONAL FACTORS: 3+ comorbidities: Gout, HTN (uncontrolled), OSA on CPAP, morbid obesity  are also affecting patient's functional outcome.   REHAB POTENTIAL: Good  CLINICAL DECISION MAKING: Evolving/moderate complexity  EVALUATION COMPLEXITY:  Moderate   PLAN:  PT FREQUENCY: 1x/week  PT DURATION: 6 weeks  PLANNED INTERVENTIONS: Therapeutic exercises, Therapeutic activity, Neuromuscular re-education, Balance training, Gait training, Patient/Family education, Self Care, Stair training, Vestibular training, Canalith repositioning, Visual/preceptual remediation/compensation, Dry Needling, Electrical stimulation, Cryotherapy, Moist heat, and Re-evaluation  PLAN FOR NEXT SESSION: review gaze and progress as able, compliant surface activities, dynamic and static balance activities, reassess canals as indicated   Laureen Abrahams, PT, DPT 05/10/22 11:28 AM

## 2022-05-09 NOTE — Patient Instructions (Signed)
Go to the ER as we discussed  Please start taking blood pressure medication as ordered  Please follow-up with Dr. [name] in 1 to 2 weeks to check on symptoms

## 2022-05-09 NOTE — Telephone Encounter (Signed)
Patient was seen by Jose James today

## 2022-05-09 NOTE — ED Triage Notes (Signed)
Patient here POV from Home.  Endorses Dizziness that began Friday. Constant and worsened since it began.   Some nausea initially that subsided. No Diarrhea. No Pain. No SOB.   Is supposed to take some HTN Medications that he takes inconsistently.   NAD Noted during Triage. A&Ox4. GCS 15. Ambulatory.

## 2022-05-09 NOTE — ED Provider Notes (Signed)
[place] EMERGENCY DEPARTMENT AT [facility] Provider Note   CSN: [id number] Arrival date & time: 05/09/22  1147     History  Chief Complaint  Patient presents with   Dizziness   HPI Jose James is a 42 y.o. male with hypertension, high cholesterol and GERD presenting for dizziness.  Started on Friday while sitting in the car.  It came on all of a sudden and lasted 10 minutes.  Endorses associated room spinning sensation.  Symptoms abated but returned.  He took his blood pressure medicine.  States that sometimes he forgets to take it here and there this is a weekly occurrence.  On Saturday in the morning he vomited twice and the dizziness returned and has since been persistent.  Gait is normal but continues to endorse worsening sensation.  Denies cough congestion fever.  Was seen for this complaint by PCP and was prescribed Zofran and meclizine.  Zofran seem to help the nausea.  Meclizine did not help the dizziness.  States that vision is overall normal but when he moves his head too quickly he sees multiple images of the same object "kind of like the Matrix".   Dizziness      Home Medications Prior to Admission medications   Medication Sig Start Date End Date Taking? Authorizing Provider  allopurinol (ZYLOPRIM) 100 MG tablet TAKE 1 TABLET(100 MG) BY MOUTH DAILY 08/12/21   Ardith Dark, MD  losartan-hydrochlorothiazide [facility]) 100-12.5 MG tablet TAKE 1 TABLET BY MOUTH DAILY 08/12/21   Ardith Dark, MD  meloxicam (MOBIC) 15 MG tablet TAKE 1 TABLET(15 MG) BY MOUTH DAILY AS NEEDED 04/06/22   Ardith Dark, MD  ondansetron (ZOFRAN) 4 MG tablet Take 4 mg by mouth every 8 (eight) hours as needed. 05/07/22   [provider]  Semaglutide,0.25 or 0.5MG /DOS, (OZEMPIC, 0.25 OR 0.5 MG/DOSE,) 2 MG/3ML SOPN Inject 0.5 mg into the skin once a week. 04/22/22   Ardith Dark, MD  valACYclovir (VALTREX) 1000 MG tablet Take 1 tablet (1,000 mg total) by mouth daily. Patient  taking differently: Take 1,000 mg by mouth as needed. 05/20/21   Ardith Dark, MD      Allergies    Penicillins    Review of Systems   Review of Systems  Neurological:  Positive for dizziness.    Physical Exam   Vitals:   05/09/22 1620 05/09/22 1652  BP: (!) 126/99 (!) 150/98  Pulse:  70  Resp: 16 16  Temp:  98.6 F (37 C)  SpO2: 98% 98%    CONSTITUTIONAL:  well-appearing, NAD NEURO:  GCS 15. Speech is goal oriented. No deficits appreciated to CN III-XII; symmetric eyebrow raise, no facial drooping, tongue midline. Patient has equal grip strength bilaterally with 5/5 strength against resistance in all major muscle groups bilaterally. Sensation to light touch intact. Patient moves extremities without ataxia. Normal finger-nose-finger. Patient ambulatory with steady gait.  EYES:  eyes equal and reactive ENT/NECK:  Supple, no stridor, mild erythema in both TMs left greater than right, no obvious middle ear effusion noted CARDIO:  regular rate and rhythm, appears well-perfused  PULM:  No respiratory distress, CTAB GI/GU:  non-distended, soft MSK/SPINE:  No gross deformities, no edema, moves all extremities  SKIN:  no rash, atraumatic   *Additional and/or pertinent findings included in MDM below    ED Results / Procedures / Treatments   Labs (all labs ordered are listed, but only abnormal results are displayed) Labs Reviewed  BASIC METABOLIC PANEL -  Abnormal; Notable for the following components:      Result Value   Glucose, Bld 105 (*)    Creatinine, Ser 1.57 (*)    GFR, Estimated 56 (*)    All other components within normal limits  URINALYSIS, ROUTINE W REFLEX MICROSCOPIC  CBC    EKG EKG Interpretation  Date/Time:  Monday May 09 2022 11:57:25 EST Ventricular Rate:  70 PR Interval:  132 QRS Duration: 90 QT Interval:  386 QTC Calculation: 417 R Axis:   53 Text Interpretation: Sinus rhythm No significant change was found Confirmed by Ezequiel Essex  [phone]) on 05/09/2022 12:08:16 PM  Radiology MR Brain Wo Contrast (neuro protocol)  Result Date: 05/09/2022 CLINICAL DATA:  Headache, neuro deficit EXAM: MRI HEAD WITHOUT CONTRAST TECHNIQUE: Multiplanar, multiecho pulse sequences of the brain and surrounding structures were obtained without intravenous contrast. COMPARISON:  No prior MRI available, correlation is made with MRA 07/20/2021 and CTA head and neck 05/09/2022 FINDINGS: Brain: No restricted diffusion to suggest acute or subacute infarct. No acute hemorrhage, mass, mass effect, or midline shift. No hydrocephalus or extra-axial collection. No hemosiderin deposition to suggest remote hemorrhage. Normal pituitary and craniocervical junction. Vascular: Patent arterial flow voids. Skull and upper cervical spine: Normal marrow signal. Sinuses/Orbits: Clear paranasal sinuses. No acute finding in the orbits. Other: The mastoid air cells are well aerated. IMPRESSION: No acute intracranial process.  In Electronically Signed   By: Merilyn Baba M.D.   On: 05/09/2022 18:28   CT Angio Head Neck W WO CM  Result Date: 05/09/2022 CLINICAL DATA:  Vertigo EXAM: CT ANGIOGRAPHY HEAD AND NECK TECHNIQUE: Multidetector CT imaging of the head and neck was performed using the standard protocol during bolus administration of intravenous contrast. Multiplanar CT image reconstructions and MIPs were obtained to evaluate the vascular anatomy. Carotid stenosis measurements (when applicable) are obtained utilizing NASCET criteria, using the distal internal carotid diameter as the denominator. RADIATION DOSE REDUCTION: This exam was performed according to the departmental dose-optimization program which includes automated exposure control, adjustment of the mA and/or kV according to patient size and/or use of iterative reconstruction technique. CONTRAST:  7mL OMNIPAQUE IOHEXOL 350 MG/ML SOLN COMPARISON:  No prior CTA available, correlation is made with MRA head 07/20/2021  FINDINGS: CT HEAD FINDINGS Brain: No evidence of acute infarct, hemorrhage, mass, mass effect, or midline shift. No hydrocephalus or extra-axial fluid collection. Vascular: No hyperdense vessel. Skull: Negative for fracture or focal lesion. Sinuses/Orbits: Clear paranasal sinuses. No acute finding in the orbits. Other: The mastoid air cells are well aerated. CTA NECK FINDINGS Aortic arch: Two-vessel arch with a common origin of the brachiocephalic and left common carotid arteries. Imaged portion shows no evidence of aneurysm or dissection. No significant stenosis of the major arch vessel origins. Right carotid system: No evidence of stenosis, dissection, or occlusion. Left carotid system: No evidence of stenosis, dissection, or occlusion. Vertebral arteries: No evidence of stenosis, dissection, or occlusion. Skeleton: No acute osseous abnormality. Other neck: Negative. Upper chest: No focal pulmonary opacity or pleural effusion. Review of the MIP images confirms the above findings CTA HEAD FINDINGS Anterior circulation: Both internal carotid arteries are patent to the termini, without significant stenosis. A1 segments patent. Normal anterior communicating artery. Anterior cerebral arteries are patent to their distal aspects. No M1 stenosis or occlusion. MCA branches perfused and symmetric. Posterior circulation: Vertebral arteries patent to the vertebrobasilar junction without stenosis. Posterior inferior cerebellar arteries patent proximally. Basilar patent to its distal aspect. Superior cerebellar arteries patent proximally.  Patent P1 segments. PCAs perfused to their distal aspects without stenosis. The bilateral posterior communicating arteries are not visualized. Venous sinuses: As permitted by contrast timing, patent. Anatomic variants: None significant. Review of the MIP images confirms the above findings IMPRESSION: 1. No acute intracranial process. 2. No intracranial large vessel occlusion or significant  stenosis. 3. No hemodynamically significant stenosis in the neck. Electronically Signed   By: Merilyn Baba M.D.   On: 05/09/2022 13:56    Procedures Procedures    Medications Ordered in ED Medications  sodium chloride 0.9 % bolus 500 mL (0 mLs Intravenous Stopped 05/09/22 1507)  iohexol (OMNIPAQUE) 350 MG/ML injection 100 mL (75 mLs Intravenous Contrast Given 05/09/22 1337)  acetaminophen (TYLENOL) tablet 650 mg (650 mg Oral Given 05/09/22 1824)    ED Course/ Medical Decision Making/ A&P                             Medical Decision Making Amount and/or Complexity of Data Reviewed Labs: ordered. Radiology: ordered.  Risk OTC drugs. Prescription drug management.   Initial Impression and Ddx 42 year old male who is well-appearing, nontoxic and hemodynamic stable presenting for dizziness.  Physical exam notable for elevated blood pressure but otherwise reassuring.  Differential diagnosis for this complaint includes stroke, metabolic derangement, dehydration, peripheral vertigo and hypertensive emergency. Patient PMH that increases complexity of ED encounter: Hyperlipidemia, and poorly controlled hypertension  Interpretation of Diagnostics I independent reviewed and interpreted the labs as followed: Hyperglycemia, elevated creatinine  - I independently visualized the following imaging with scope of interpretation limited to determining acute life threatening conditions related to emergency care: CTA angio of head and neck, which revealed due to intracranial process  I independently reviewed and interpreted his EKG which revealed normal sinus rhythm  Patient Reassessment and Ultimate Disposition/Management Volume resuscitated with normal saline bolus.  Given his prolonged history of poorly controlled hypertension along with high cholesterol and persistence of his dizziness, along with visual disturbance and endorsed gait ataxia there is concern for posterior circulation stroke.   Fortunately CTA was negative.  Upon reevaluation patient stated that he continued to feel dizzy.  At this point, we had a shared discussion about further evaluation with MRI.  Patient agreed to ED to ED transfer for further evaluation and MRI to rule out stroke.  Discussed patient with Dr. Ursula Beath, ED attending physician at [facility].  Patient was hemodynamically stable for this encounter with normal neuro status, for this reason I felt it was appropriate for him to transfer to ED via POV for further evaluation.    Patient management required discussion with the following services or consulting groups:  None  Complexity of Problems Addressed Acute complicated illness or Injury  Additional Data Reviewed and Analyzed Further history obtained from: None and Past medical history and medications listed in the EMR  Patient Encounter Risk Assessment Consideration of hospitalization         Final Clinical Impression(s) / ED Diagnoses Final diagnoses:  Dizziness    Rx / DC Orders ED Discharge Orders     None         Harriet Pho, PA-C 05/10/22 0846    Ezequiel Essex, MD 05/10/22 1154

## 2022-05-10 ENCOUNTER — Other Ambulatory Visit: Payer: Self-pay

## 2022-05-10 ENCOUNTER — Ambulatory Visit: Payer: 59 | Admitting: Physical Therapy

## 2022-05-10 ENCOUNTER — Encounter: Payer: Self-pay | Admitting: Physical Therapy

## 2022-05-10 ENCOUNTER — Ambulatory Visit: Payer: 59 | Admitting: Family

## 2022-05-10 ENCOUNTER — Encounter: Payer: Self-pay | Admitting: Family

## 2022-05-10 VITALS — BP 147/115 | HR 79

## 2022-05-10 VITALS — BP 142/99 | HR 72 | Temp 97.7°F | Ht 70.0 in | Wt 228.2 lb

## 2022-05-10 DIAGNOSIS — R2681 Unsteadiness on feet: Secondary | ICD-10-CM

## 2022-05-10 DIAGNOSIS — H9202 Otalgia, left ear: Secondary | ICD-10-CM | POA: Diagnosis not present

## 2022-05-10 DIAGNOSIS — G8929 Other chronic pain: Secondary | ICD-10-CM | POA: Diagnosis not present

## 2022-05-10 DIAGNOSIS — I10 Essential (primary) hypertension: Secondary | ICD-10-CM | POA: Diagnosis not present

## 2022-05-10 DIAGNOSIS — R42 Dizziness and giddiness: Secondary | ICD-10-CM | POA: Diagnosis not present

## 2022-05-10 DIAGNOSIS — E538 Deficiency of other specified B group vitamins: Secondary | ICD-10-CM | POA: Diagnosis not present

## 2022-05-10 LAB — VITAMIN B12: Vitamin B-12: >1500 pg/mL — ABNORMAL HIGH (ref 211–911)

## 2022-05-10 MED ORDER — CYANOCOBALAMIN 1000 MCG/ML IJ SOLN
1000.0000 ug | Freq: Once | INTRAMUSCULAR | Status: AC
  Administered 2022-05-10: 1000 ug via INTRAMUSCULAR

## 2022-05-10 MED ORDER — OLMESARTAN MEDOXOMIL-HCTZ 40-12.5 MG PO TABS: 1.0000 | ORAL_TABLET | Freq: Every day | ORAL | 5 refills | Status: DC

## 2022-05-10 NOTE — Progress Notes (Signed)
Patient ID: Jose James, male    DOB: November 25, 1980, 42 y.o.   MRN: [id number]  Chief Complaint  Patient presents with   Ear Pain    Pt c/o left ear pain/pressure. Pt was seen in ED yesterday and there was fluid behind his ear.     HPI:      Ear pain:  pt reports having severe pain last night while lying down in left ear, seen in ER yesterday and told it looked like there was fluid behind his ear, pt was also having dizziness at that time. His BP was also elevated & reports at home he has also seen some higher readings, but none above 160/110. Per review of his chart, he was last seen by ENT in 2021 for pulsating tinnitus and decreased hearing in left ear. They suggested he have a fan on while sleeping to lessen the tinnitus which did not help. Hearing tested borderline in left ear per review of records, also he was advised to have further testing done in [place] called "semi-circular canal dehiscence screening" (VEMP testing), but pt has not pursued this to date.     Hypertension: Patient is currently maintained on the following medications for blood pressure: Losartan-HCTZ   Failed meds include: Lisinopril, maxed dose Patient reports good compliance with blood pressure medications. Patient denies chest pain, headaches, shortness of breath or swelling. Last 3 blood pressure readings in our office are as follows:  Home readings 150/110. BP Readings from Last 3 Encounters:  05/10/22 (!) 142/99  05/10/22 (!) 156/103  05/09/22 (!) 150/98    Assessment & Plan:   Problem List Items Addressed This Visit       Cardiovascular and Mediastinum   Hypertension - Primary    chronic - slightly unstable taking Losartan-HCTZ - admits to missing about 1 dose/week home readings not at goal pt present with wife, also reports hearing his heart beat in left ear along with pain in ear switching to Olmesartan-HCTZ 40-12.5, advised on use & SE advised on taking extra pills in his car - ok to take later if  forgets f/u with PCP      Relevant Medications   olmesartan-hydrochlorothiazide (BENICAR HCT) 40-12.5 MG tablet   Other Visit Diagnoses     Chronic left ear pain    - left ear w/mild effusion, no erythema or bulging. Believe his pulsations are worsened by his elevated BP. Switching BP meds to help lower. Sending new referral to ENT, pt seen by [facility] 2 years ago.    Relevant Orders   Ambulatory referral to ENT   Vitamin B12 deficiency    - injection given today, pt with wife who states he never started the oral OTC vitamins. Giving injection today & checking level again. Pt advised to start OTC pills tomorrow, f/u with PCP.    Relevant Medications   cyanocobalamin (VITAMIN B12) injection 1,000 mcg (Completed)   Other Relevant Orders   Vitamin B12 (Completed)    Subjective:    Outpatient Medications Prior to Visit  Medication Sig Dispense Refill   allopurinol (ZYLOPRIM) 100 MG tablet TAKE 1 TABLET(100 MG) BY MOUTH DAILY 90 tablet 3   losartan-hydrochlorothiazide (HYZAAR) 100-12.5 MG tablet TAKE 1 TABLET BY MOUTH DAILY 90 tablet 3   meclizine (ANTIVERT) 12.5 MG tablet Take 12.5 mg by mouth 3 (three) times daily as needed for dizziness.     meloxicam (MOBIC) 15 MG tablet TAKE 1 TABLET(15 MG) BY MOUTH DAILY AS NEEDED 30 tablet 1  ondansetron (ZOFRAN) 4 MG tablet Take 4 mg by mouth every 8 (eight) hours as needed.     Semaglutide,0.25 or 0.5MG /DOS, (OZEMPIC, 0.25 OR 0.5 MG/DOSE,) 2 MG/3ML SOPN Inject 0.5 mg into the skin once a week. 3 mL 1   valACYclovir (VALTREX) 1000 MG tablet Take 1 tablet (1,000 mg total) by mouth daily. (Patient taking differently: Take 1,000 mg by mouth as needed.) 90 tablet 0   No facility-administered medications prior to visit.   Past Medical History:  Diagnosis Date   Allergy    GERD (gastroesophageal reflux disease)    Gout    High cholesterol    Hypertension    Kidney stone    Past Surgical History:  Procedure Laterality Date    TONSILLECTOMY     Allergies  Allergen Reactions   Penicillins Other (See Comments)    Childhood allergy      Objective:    Physical Exam Vitals and nursing note reviewed.  Constitutional:      General: He is not in acute distress.    Appearance: Normal appearance.  HENT:     Head: Normocephalic.     Right Ear: Tympanic membrane and ear canal normal.     Left Ear: Ear canal normal. No tenderness. Tympanic membrane is injected (mild w/slightly dull cone of light). Tympanic membrane is not erythematous, retracted or bulging.  Cardiovascular:     Rate and Rhythm: Normal rate and regular rhythm.  Pulmonary:     Effort: Pulmonary effort is normal.     Breath sounds: Normal breath sounds.  Musculoskeletal:        General: Normal range of motion.     Cervical back: Normal range of motion.  Skin:    General: Skin is warm and dry.  Neurological:     Mental Status: He is alert and oriented to person, place, and time.  Psychiatric:        Mood and Affect: Mood normal.    BP (!) 142/99 (BP Location: Left Arm, Patient Position: Sitting, Cuff Size: Large)   Pulse 72   Temp 97.7 F (36.5 C) (Temporal)   Ht 5\' 10"  (1.778 m)   Wt 228 lb 3.2 oz (103.5 kg)   SpO2 96%   BMI 32.74 kg/m  Wt Readings from Last 3 Encounters:  05/10/22 228 lb 3.2 oz (103.5 kg)  05/09/22 229 lb 0.9 oz (103.9 kg)  05/09/22 229 lb (103.9 kg)       Jeanie Sewer, NP

## 2022-05-10 NOTE — Assessment & Plan Note (Signed)
chronic - slightly unstable taking Losartan-HCTZ - admits to missing about 1 dose/week home readings not at goal pt present with wife, also reports hearing his heart beat in left ear along with pain in ear switching to Olmesartan-HCTZ 40-12.5, advised on use & SE advised on taking extra pills in his car - ok to take later if forgets f/u with PCP

## 2022-05-11 NOTE — Progress Notes (Signed)
I was hoping we could get your lab drawn quickly after the injection and then your level would not be so high, but I'm guessing you had to wait before having drawn. I would start the over the counter vitamins you have next week, and take 1 daily. Can take up to 1,062mcg daily. Recheck with Dr [name] in 1-2 months again to be sure in normal range. Take care.

## 2022-05-14 ENCOUNTER — Encounter: Payer: Self-pay | Admitting: Physical Therapy

## 2022-05-16 ENCOUNTER — Ambulatory Visit: Payer: 59 | Admitting: Family Medicine

## 2022-05-17 ENCOUNTER — Encounter: Payer: 59 | Admitting: Physical Therapy

## 2022-05-17 NOTE — Therapy (Incomplete)
OUTPATIENT PHYSICAL THERAPY TREATMENT NOTE   Patient Name: Jose James MRN: [id number] DOB:October 09, 1980, 42 y.o., male Today's Date: 05/17/2022  END OF SESSION:    Past Medical History:  Diagnosis Date   Allergy    GERD (gastroesophageal reflux disease)    Gout    High cholesterol    Hypertension    Kidney stone    Past Surgical History:  Procedure Laterality Date   TONSILLECTOMY     Patient Active Problem List   Diagnosis Date Noted   Morbid obesity ([place]) 01/26/2021   OSA on CPAP 11/02/2020   Shift work sleep disorder 11/02/2020   GERD (gastroesophageal reflux disease)    Pulsatile tinnitus 07/02/2020   Cold sore 07/02/2020   Gout 07/02/2020   Hypertension 12/30/2013     THERAPY DIAG:  No diagnosis found.   PCP: Dimas Chyle, MD  REFERRING PROVIDER: Inda Coke, PA  REFERRING DIAG: R42 (ICD-10-CM) - Vertigo  EVAL THERAPY DIAG:  Dizziness and giddiness - Plan: PT plan of care cert/re-cert  Unsteadiness on feet - Plan: PT plan of care cert/re-cert  ONSET DATE: [birth year]  Rationale for Evaluation and Treatment: Rehabilitation  SUBJECTIVE:   SUBJECTIVE STATEMENT: *** Reports onset of symptoms on 05/06/22, described as sudden onset of spinning when sitting in car.  Symptoms lasted 2-3 min, then about 15 min later started again.  Took BP medication and drank water, with symptoms continuing.  Constant dizziness started Saturday with positive emesis, virtual visit Rx zofran and meclizine.  Then had another episode in the shower feeling like he would pass out.    Pt accompanied by: self  PERTINENT HISTORY: Gout, HTN (uncontrolled), OSA on CPAP, morbid obesity  PAIN:  Are you having pain? No  PRECAUTIONS: None  WEIGHT BEARING RESTRICTIONS: No  FALLS: Has patient fallen in last 6 months? 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, and Leisure: work  PATIENT GOALS: improved dizziness  OBJECTIVE:   DIAGNOSTIC FINDINGS: MRI negative for CVA  COGNITION: Overall cognitive status: Within functional limits for tasks assessed   SENSATION: WFL  GAIT: 05/10/22: appears WNL, occasional episodes of imbalance when stopping due to vertigo, but pt able to self correct  VESTIBULAR ASSESSMENT:  GENERAL OBSERVATION: no symptoms this morning at rest; hasn't taken meclizine since 05/08/22   SYMPTOM BEHAVIOR:  Subjective history: see above  Non-Vestibular symptoms: nausea/vomiting and heartbeat with fullness in Lt ear  Type of dizziness: Imbalance (Disequilibrium), Spinning/Vertigo, and Unsteady with head/body turns  Frequency: initially constant, now quick movements (getting up too quick, turning head or body too quick)  Duration: none for 24 hours  Aggravating factors: Induced by position change: supine to sit and sit to stand and Induced by motion: occur when walking, turning body quickly, and turning head quickly  Relieving factors: no known relieving factors and rest  Progression of symptoms: better  OCULOMOTOR EXAM:  Ocular Alignment: normal  Ocular ROM: No Limitations  Spontaneous Nystagmus: absent  Gaze-Induced Nystagmus: right beating with right gaze  Smooth Pursuits: intact  Saccades: intact, slow, and slow to Rt  Convergence/Divergence: Lt eye tracks laterally with convergence at 2 cm  VESTIBULAR - OCULAR REFLEX:   VOR: Comment: unable to maintain gaze, catch up saccades noted  Head-Impulse Test: HIT Right: 1st test with catch up saccade, 2nd able to maintain gaze  HIT Left: positive   POSITIONAL TESTING: Right Dix-Hallpike: upbeating, right nystagmus and possibly due to neuritis - minimal symptoms in this position with nystagmus Left Dix-Hallpike: no nystagmus Right Roll Test: no  nystagmus Left Roll Test: no nystagmus  MOTION SENSITIVITY:  Motion Sensitivity Quotient Intensity: 0 = none, 1 = Lightheaded, 2 = Mild, 3 = Moderate, 4 = Severe, 5 = Vomiting  Intensity  1. Sitting to supine   2. Supine to L side   3. Supine to R side   4. Supine to sitting   5. L Hallpike-Dix   6. Up from L    7. R Hallpike-Dix   8. Up from R    9. Sitting, head tipped to L knee   10. Head up from L knee   11. Sitting, head tipped to R knee   12. Head up from R knee   13. Sitting head turns x5   14.Sitting head nods x5   15. In stance, 180 turn to L    16. In stance, 180 turn to R     OTHOSTATICS: not done  BALANCE:  05/10/22: increased sway on complaint surface with EC with feet apart and feet together   VESTIBULAR TREATMENT:                                                                                                   DATE:  05/17/22 ***  05/10/22 Gaze Adaptation: x1 Viewing Horizontal: Position: sitting, Time: 10 sec, Reps: 1, and Comment: unable to maintain gaze and x1 Viewing Vertical:  Position: sitting, Time: 15 sec, Reps: 1, and Comment: no symptoms, intact Habituation: Other: Feet together with EC on compliant surface x 20 sec   PATIENT EDUCATION: Education details: HEP, vestibular neuritis Person educated: Patient Education method: Explanation, Demonstration, and Handouts Education comprehension: verbalized understanding, returned demonstration, and needs further education  HOME EXERCISE PROGRAM: Access Code: [email or web address] URL: [email or web address] Date: 05/10/2022 Prepared by: Faustino Congress  Exercises - Seated Gaze Stabilization with Head Rotation  - 2-3 x daily - 7 x weekly - 1 sets - 30-60 seconds - Romberg Stance Eyes Closed on Foam Pad  - 2-3 x daily - 7 x weekly - 1 sets - 5 reps - 15-20 sec hold  Patient Education - Gaze Stabilization  GOALS: Goals reviewed with patient? Yes  SHORT TERM GOALS: Target date:  05/31/2022  Independent with initial HEP Goal status: INITIAL  LONG TERM GOALS: Target date: 06/21/2022  Independent with final HEP if indicated Goal status: INITIAL  2.  Demonstrate ability to stand on compliant surface with EC x 30 sec and feet together with minimal sway Goal status: INITIAL  3.  Perform horizontal VOR and maintain gaze for improved visual input and balance Goal status: INITIAL  4.  Demonstrate negative positional testing (if indicated) Goal status: INITIAL   ASSESSMENT:  CLINICAL IMPRESSION: *** Patient is a 42 y.o. male who was seen today for physical therapy evaluation and treatment for sudden onset of vertigo 05/06/22 consistent with Lt vestibular neuritis.  Vertigo may be multifactorial as BP is still hypertensive  today and he is being closely followed by his PCP, and CVA has been r/o at ED.  He may also have BPPV but will await until gaze induced nystagmus resolves to fully assess/treat.  Will benefit from PT to address deficits listed.   OBJECTIVE IMPAIRMENTS: decreased balance, decreased coordination, decreased mobility, dizziness, and impaired vision/preception.   ACTIVITY LIMITATIONS: carrying, lifting, bending, sitting, standing, transfers, bathing, toileting, dressing, hygiene/grooming, and locomotion level  PARTICIPATION LIMITATIONS: cleaning, laundry, driving, community activity, occupation, and yard work  PERSONAL FACTORS: 3+ comorbidities: Gout, HTN (uncontrolled), OSA on CPAP, morbid obesity are also affecting patient's functional outcome.   REHAB POTENTIAL: Good  CLINICAL DECISION MAKING: Evolving/moderate complexity  EVALUATION COMPLEXITY: Moderate   PLAN:  PT FREQUENCY: 1x/week  PT DURATION: 6 weeks  PLANNED INTERVENTIONS: Therapeutic exercises, Therapeutic activity, Neuromuscular re-education, Balance training, Gait training, Patient/Family education, Self Care, Stair training, Vestibular training, Canalith repositioning,  Visual/preceptual remediation/compensation, Dry Needling, Electrical stimulation, Cryotherapy, Moist heat, and Re-evaluation  PLAN FOR NEXT SESSION: *** review gaze and progress as able, compliant surface activities, dynamic and static balance activities, reassess canals as indicated    Faustino Congress, PT 05/17/2022, 7:07 AM

## 2022-05-18 ENCOUNTER — Encounter: Payer: 59 | Admitting: Physical Therapy

## 2022-05-19 ENCOUNTER — Ambulatory Visit: Payer: 59 | Admitting: Family Medicine

## 2022-05-19 ENCOUNTER — Encounter: Payer: Self-pay | Admitting: Family Medicine

## 2022-05-19 VITALS — BP 111/75 | HR 81 | Temp 97.5°F | Ht 70.0 in | Wt 231.0 lb

## 2022-05-19 DIAGNOSIS — E538 Deficiency of other specified B group vitamins: Secondary | ICD-10-CM | POA: Diagnosis not present

## 2022-05-19 DIAGNOSIS — I10 Essential (primary) hypertension: Secondary | ICD-10-CM

## 2022-05-19 DIAGNOSIS — H93A9 Pulsatile tinnitus, unspecified ear: Secondary | ICD-10-CM

## 2022-05-19 DIAGNOSIS — R42 Dizziness and giddiness: Secondary | ICD-10-CM

## 2022-05-19 NOTE — Patient Instructions (Signed)
It was very nice to see you today!  I am glad that you are feeling better.  We will refer you to see the ear nose and throat doctor.  No other medication changes today.    Take care, Dr [name]  PLEASE NOTE:  If you had any lab tests, please let us know if you have not heard back within a few days. You may see your results on mychart before we have a chance to review them but we will give you a call once they are reviewed by [place].   If we ordered any referrals today, please let us know if you have not heard from their office within the next week.   If you had any urgent prescriptions sent in today, please check with the pharmacy within an hour of our visit to make sure the prescription was transmitted appropriately.   Please try these tips to maintain a healthy lifestyle:  Eat at least 3 REAL meals and 1-2 snacks per day.  Aim for no more than 5 hours between eating.  If you eat breakfast, please do so within one hour of getting up.   Each meal should contain half fruits/vegetables, one quarter protein, and one quarter carbs (no bigger than a computer mouse)  Cut down on sweet beverages. This includes juice, soda, and sweet tea.   Drink at least 1 glass of water with each meal and aim for at least 8 glasses per day  Exercise at least 150 minutes every week.

## 2022-05-19 NOTE — Assessment & Plan Note (Signed)
Symptoms are still persistent.  His recent imaging results were all normal.  Will be placing referral to ENT as above.

## 2022-05-19 NOTE — Assessment & Plan Note (Signed)
Thankfully symptoms have resolved.  He has a reassuring exam today.  His neurologic workup in the ED was negative.  He did see the physical therapist who is potentially concerned about vestibular neuritis.  Of note at his last ENT visit a few years ago they had documented potential concern for superior semicircular canal dehiscence however at that time he was not having any vertigo and they did not pursue this workup further.  He is still having quite a bit of issues with pulsatile tinnitus as well.  Will be placing referral to ENT for further management evaluation.  We discussed reasons to return to care.  He will let me know if he has any returning vertigo symptoms.

## 2022-05-19 NOTE — Assessment & Plan Note (Signed)
Much better controlled today on olmesartan-hydrochlorothiazide 40-12.5 once daily.  Will continue for now.

## 2022-05-19 NOTE — Progress Notes (Signed)
Jose James is a 42 y.o. male who presents today for an office visit.  Assessment/Plan:  Chronic Problems Addressed Today: Vertigo Thankfully symptoms have resolved.  He has a reassuring exam today.  His neurologic workup in the ED was negative.  He did see the physical therapist who is potentially concerned about vestibular neuritis.  Of note at his last ENT visit a few years ago they had documented potential concern for superior semicircular canal dehiscence however at that time he was not having any vertigo and they did not pursue this workup further.  He is still having quite a bit of issues with pulsatile tinnitus as well.  Will be placing referral to ENT for further management evaluation.  We discussed reasons to return to care.  He will let me know if he has any returning vertigo symptoms.  Pulsatile tinnitus Symptoms are still persistent.  His recent imaging results were all normal.  Will be placing referral to ENT as above.  Hypertension Much better controlled today on olmesartan-hydrochlorothiazide 40-12.5 once daily.  Will continue for now.  B12 deficiency Taking oral supplementation.  He did have labs drawn about a week ago however this was right after his injection.  He will come back in a couple months and we can recheck B12 at that time.  He does feel like the supplementation is helping with energy levels.     Subjective:  HPI:  Patient here for follow up. We last saw him a few months ago for his annual physical.    Since our last visit he went to the ED a little over a week ago with an episode of vertigo and dizziness.  He initially came to this office and was seen by different provider.  He had severe symptoms at that point including vomiting and concern for dehydration in addition to his vertigo.  He was directed to go straight to the emergency room.  In the ED he ended up getting a complete neurologic workup including CT angiogram and MRI brain which was normal. He  was diagnosed with vertigo and discharged home.  He followed up with vestibular rehab afterwards.  He has been working with vestibular rehab. On their evaluation, they were concerned about vestibular neuritis. Symptoms have been improving. Of note he did see an ENT a few years ago for pulsatile tinnitus. There was some concern for mild left sided hearing loss and potential concern for superior semicircular canal dehiscence. No further workup was persued at tha time.  He has had continued issues with pulsatile tinnitus.  We got an MRA head about a year ago which was negative.  He saw a different provider here after his ED visit and his BP medication was changed to olemsartan 40-12.5 once daily. He has done well with this. Feels like this is doing much better in terms of controlling his BP. They have been at goal at home.        Objective:  Physical Exam: BP 111/75   Pulse 81   Temp (!) 97.5 F (36.4 C) (Temporal)   Ht 5\' 10"  (1.778 m)   Wt 231 lb (104.8 kg)   SpO2 98%   BMI 33.15 kg/m   Gen: No acute distress, resting comfortably HEENT: TMs clear bilaterally. CV: Regular rate and rhythm with no murmurs appreciated Pulm: Normal work of breathing, clear to auscultation bilaterally with no crackles, wheezes, or rhonchi Neuro: Grossly normal, moves all extremities Psych: Normal affect and thought content  Time Spent: 40 minutes  of total time was spent on the date of the encounter performing the following actions: chart review prior to seeing the patient including recent ED visit, obtaining history, performing a medically necessary exam, reviewing his most recent ENT note from 2021, counseling on the treatment plan, placing orders, and documenting in our EHR.        Jose James. Jose Pain, MD 05/19/2022 9:54 AM

## 2022-05-19 NOTE — Assessment & Plan Note (Signed)
Taking oral supplementation.  He did have labs drawn about a week ago however this was right after his injection.  He will come back in a couple months and we can recheck B12 at that time.  He does feel like the supplementation is helping with energy levels.

## 2022-05-25 ENCOUNTER — Ambulatory Visit: Payer: 59 | Admitting: Physical Therapy

## 2022-05-25 ENCOUNTER — Encounter: Payer: Self-pay | Admitting: Physical Therapy

## 2022-05-25 DIAGNOSIS — R2681 Unsteadiness on feet: Secondary | ICD-10-CM | POA: Diagnosis not present

## 2022-05-25 DIAGNOSIS — R42 Dizziness and giddiness: Secondary | ICD-10-CM

## 2022-05-25 NOTE — Therapy (Addendum)
OUTPATIENT PHYSICAL THERAPY TREATMENT NOTE DISCHARGE SUMMARY   Patient Name: Jose James MRN: [id number] DOB:1980/05/17, 42 y.o., male Today's Date: 05/25/2022  END OF SESSION:   PT End of Session - 05/25/22 0800     Visit Number 2    Number of Visits 6    Date for PT Re-Evaluation 06/21/22    Authorization Type [employer]    PT Start Time 0800    PT Stop Time 0832    PT Time Calculation (min) 32 min    Activity Tolerance Patient tolerated treatment well    Behavior During Therapy [facility] for tasks assessed/performed             Past Medical History:  Diagnosis Date   Allergy    GERD (gastroesophageal reflux disease)    Gout    High cholesterol    Hypertension    Kidney stone    Past Surgical History:  Procedure Laterality Date   TONSILLECTOMY     Patient Active Problem List   Diagnosis Date Noted   Vertigo 05/19/2022   B12 deficiency 05/19/2022   Morbid obesity (HCC) 01/26/2021   OSA on CPAP 11/02/2020   Shift work sleep disorder 11/02/2020   GERD (gastroesophageal reflux disease)    Pulsatile tinnitus 07/02/2020   Cold sore 07/02/2020   Gout 07/02/2020   Hypertension 12/30/2013     THERAPY DIAG:  Dizziness and giddiness  Unsteadiness on feet   PCP: Jacquiline Doe, MD  REFERRING PROVIDER: Jarold Motto, PA  REFERRING DIAG: R42 (ICD-10-CM) - Vertigo  EVAL THERAPY DIAG:  Dizziness and giddiness - Plan: PT plan of care cert/re-cert  Unsteadiness on feet - Plan: PT plan of care cert/re-cert  ONSET DATE: 05/06/22  Rationale for Evaluation and Treatment: Rehabilitation  SUBJECTIVE:   SUBJECTIVE STATEMENT: Feels much better today, no more dizziness and BP is much more controlled.  PCP has referred to ENT. Strenuous activity increased heartbeat in his ear.  Pt accompanied by: self  PERTINENT HISTORY: Gout, HTN (uncontrolled), OSA on CPAP, morbid obesity  PAIN:  Are you having pain? No  PRECAUTIONS: None  WEIGHT BEARING RESTRICTIONS:  No  FALLS: Has patient fallen in last 6 months? 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, and Leisure: work  PATIENT GOALS: improved dizziness  OBJECTIVE:   DIAGNOSTIC FINDINGS: MRI negative for CVA  COGNITION: Overall cognitive status: Within functional limits for tasks assessed   SENSATION: WFL  GAIT: 05/10/22: appears WNL, occasional episodes of imbalance when stopping due to vertigo, but pt able to self correct  VESTIBULAR ASSESSMENT:  GENERAL OBSERVATION: no symptoms this morning at rest; hasn't taken meclizine since 05/08/22   SYMPTOM BEHAVIOR:  Subjective history: see above  Non-Vestibular symptoms: nausea/vomiting and heartbeat with fullness in Lt ear  Type of dizziness: Imbalance (Disequilibrium), Spinning/Vertigo, and Unsteady with head/body turns  Frequency: initially constant, now quick movements (getting up too quick, turning head or body too quick)  Duration: none for 24 hours  Aggravating factors: Induced by position change: supine to sit and sit to stand and Induced by motion: occur when walking, turning body quickly, and turning head quickly  Relieving factors: no known relieving factors and rest  Progression of symptoms: better  OCULOMOTOR EXAM:  Ocular Alignment: normal  Ocular ROM: No Limitations  Spontaneous Nystagmus: absent  Gaze-Induced Nystagmus: right  beating with right gaze  Smooth Pursuits: intact  Saccades: intact, slow, and slow to Rt  Convergence/Divergence: Lt eye tracks laterally with convergence at 2 cm  VESTIBULAR - OCULAR REFLEX:   VOR: Comment: unable to maintain gaze, catch up saccades noted  Head-Impulse Test: HIT Right: 1st test with catch up saccade, 2nd able to maintain gaze HIT Left:  positive   POSITIONAL TESTING: Right Dix-Hallpike: upbeating, right nystagmus and possibly due to neuritis - minimal symptoms in this position with nystagmus Left Dix-Hallpike: no nystagmus Right Roll Test: no nystagmus Left Roll Test: no nystagmus  OTHOSTATICS: not done  BALANCE:  05/10/22: increased sway on complaint surface with EC with feet apart and feet together   VESTIBULAR TREATMENT:                                                                                                   DATE:  05/25/22 Neuro Re-ED GENERAL OBSERVATION: no symptoms x 1 week; BP 132/91   OCULOMOTOR EXAM:  Gaze-Induced Nystagmus: absent   VESTIBULAR - OCULAR REFLEX:   Slow VOR: Normal  Head-Impulse Test: HIT Right: negative HIT Left: positive with catch up saccade   POSITIONAL TESTING: Right Dix-Hallpike: no nystagmus Left Dix-Hallpike: no nystagmus Right Roll Test: no nystagmus Left Roll Test: no nystagmus  Balance:  EC with feet together x 30 sec; then with horizontal and vertical head turns with LOB posteriorly Gaze x 1 standing on compliant surface x 30 sec each with feet apart and together Amb with horizontal head turns with min instability noted  05/10/22 Gaze Adaptation: x1 Viewing Horizontal: Position: sitting, Time: 10 sec, Reps: 1, and Comment: unable to maintain gaze and x1 Viewing Vertical:  Position: sitting, Time: 15 sec, Reps: 1, and Comment: no symptoms, intact Habituation: Other: Feet together with EC on compliant surface x 20 sec   PATIENT EDUCATION: Education details: HEP, vestibular neuritis Person educated: Patient Education method: Explanation, Demonstration, and Handouts Education comprehension: verbalized understanding, returned demonstration, and needs further education  HOME EXERCISE PROGRAM: Access Code: [email or web address] URL: [email or web address] Date: 05/25/2022 Prepared by: Moshe Cipro  Exercises - Romberg Stance on Foam Pad with Head  Rotation  - 2 x daily - 7 x weekly - 1 sets - 1 reps - 10 rotations each way - Narrow Stance with Eyes Closed and Head Nods on Foam Pad  - 2 x daily - 7 x weekly - 1 sets - 2 reps - 10 rotations each way - Seated Gaze Stabilization with Head Rotation  - 2 x daily - 7 x weekly - 1 sets - 30 seconds - Walking with Head Rotation  - 1 x daily - 7 x weekly - 1 sets - 3 reps - 10-20 feet  Patient Education - Gaze Stabilization  GOALS: Goals reviewed with patient? Yes  SHORT TERM GOALS: Target date: 05/31/2022  Independent with initial HEP Goal status: INITIAL  LONG TERM GOALS: Target date: 06/21/2022  Independent with final HEP if indicated Goal status: INITIAL  2.  Demonstrate ability to stand on compliant surface with EC x 30 sec and feet together  with minimal sway Goal status: MET 05/25/22  3.  Perform horizontal VOR and maintain gaze for improved visual input and balance Goal status: MET 05/25/22  4.  Demonstrate negative positional testing (if indicated) Goal status: MET 05/25/22   ASSESSMENT:  CLINICAL IMPRESSION: Pt overall nearly back to baseline, still has some high level vestibular deficits which were address with updated HEP today.  Will continue to benefit from PT to maximize function.  OBJECTIVE IMPAIRMENTS: decreased balance, decreased coordination, decreased mobility, dizziness, and impaired vision/preception.   ACTIVITY LIMITATIONS: carrying, lifting, bending, sitting, standing, transfers, bathing, toileting, dressing, hygiene/grooming, and locomotion level  PARTICIPATION LIMITATIONS: cleaning, laundry, driving, community activity, occupation, and yard work  PERSONAL FACTORS: 3+ comorbidities: Gout, HTN (uncontrolled), OSA on CPAP, morbid obesity are also affecting patient's functional outcome.   REHAB POTENTIAL: Good  CLINICAL DECISION MAKING: Evolving/moderate complexity  EVALUATION COMPLEXITY: Moderate   PLAN:  PT FREQUENCY: 1x/week  PT DURATION: 6  weeks  PLANNED INTERVENTIONS: Therapeutic exercises, Therapeutic activity, Neuromuscular re-education, Balance training, Gait training, Patient/Family education, Self Care, Stair training, Vestibular training, Canalith repositioning, Visual/preceptual remediation/compensation, Dry Needling, Electrical stimulation, Cryotherapy, Moist heat, and Re-evaluation  PLAN FOR NEXT SESSION: continue with high level vestibular exercises PRN,  review gaze and progress as able, compliant surface activities, dynamic and static balance activities   Clarita Crane, PT, DPT 05/25/22 11:46 AM     PHYSICAL THERAPY DISCHARGE SUMMARY  Visits from Start of Care: 2  Current functional level related to goals / functional outcomes: See above   Remaining deficits: See above   Education / Equipment: HEP   Patient agrees to discharge. Patient goals were partially met. Patient is being discharged due to not returning since the last visit.   Clarita Crane, PT, DPT 08/22/22 2:46 PM  [facility] [address] [place], [place], [address] Phone: [phone]   Fax:  [phone]

## 2022-05-30 ENCOUNTER — Other Ambulatory Visit: Payer: 59

## 2022-06-03 ENCOUNTER — Encounter: Payer: 59 | Admitting: Physical Therapy

## 2022-06-17 ENCOUNTER — Other Ambulatory Visit: Payer: Self-pay | Admitting: Physician Assistant

## 2022-06-17 DIAGNOSIS — H93A2 Pulsatile tinnitus, left ear: Secondary | ICD-10-CM

## 2022-07-31 IMAGING — MR MR MRA HEAD W/O CM
1 series · 11 of 48 positions shown · non-contrast
Comparison: No pertinent prior exams available for comparison.

CLINICAL DATA: Provided history: Tinnitus, pulsatile. Additional
history provided by scanning technologist: Patient reports pulsatile
tinnitus in left ear for 2 years.

EXAM:
MRA HEAD WITHOUT CONTRAST
TECHNIQUE: Angiographic images of the Circle of Willis were acquired using MRA
technique without intravenous contrast.

[Series 5: [id number] · axial · 0.6mm · 0.26mm/px · z∈[-30,+47]mm · 11 of 149 slices shown]
[im 10/149]
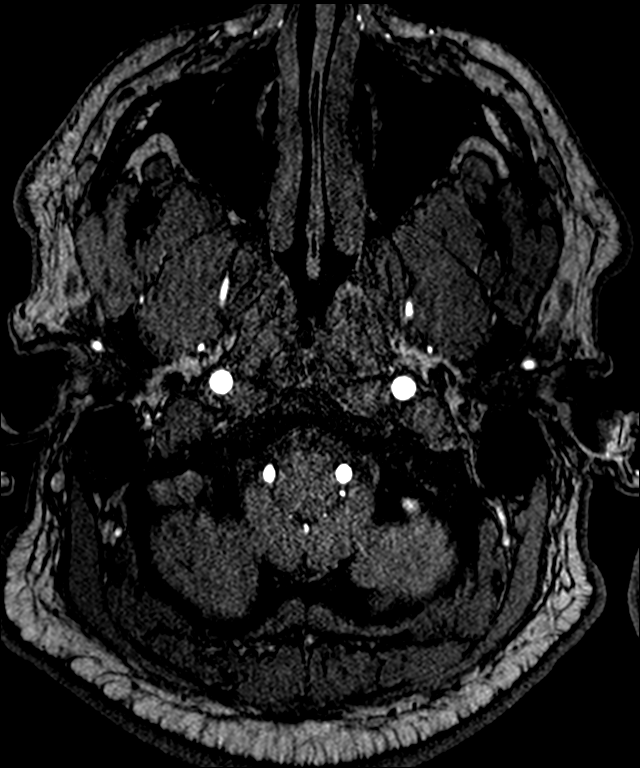
[im 26/149]
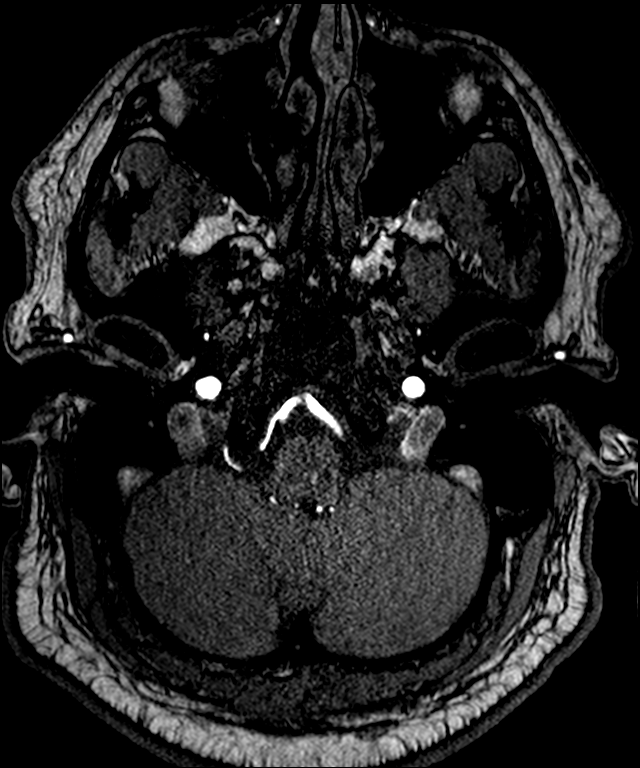
[im 29/149]
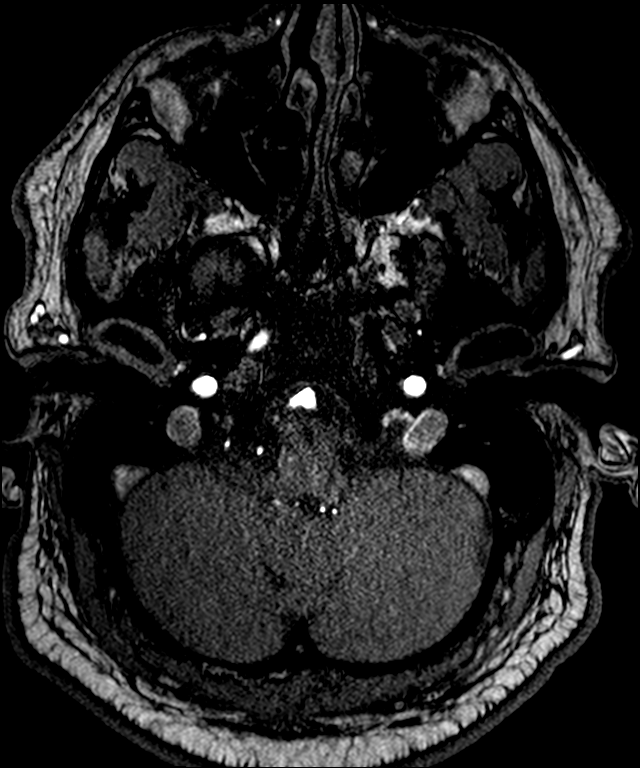
[im 48/149]
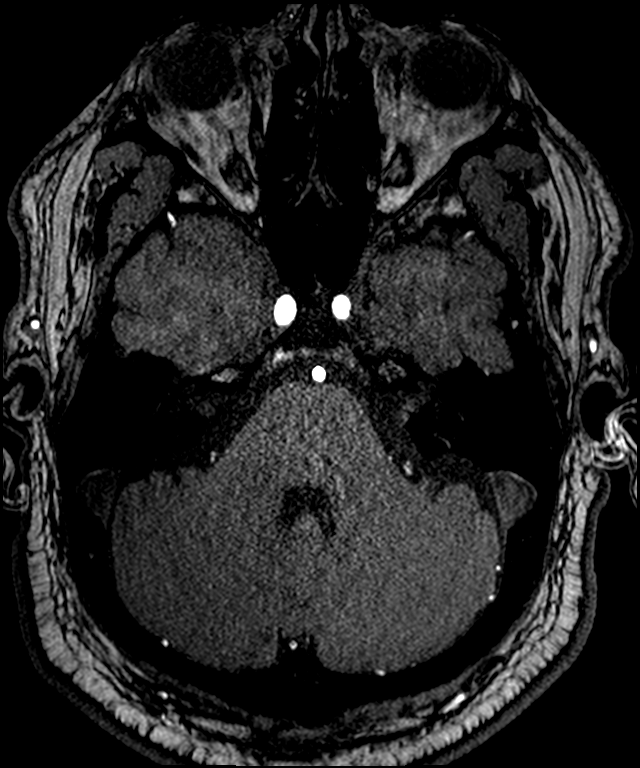
[im 67/149]
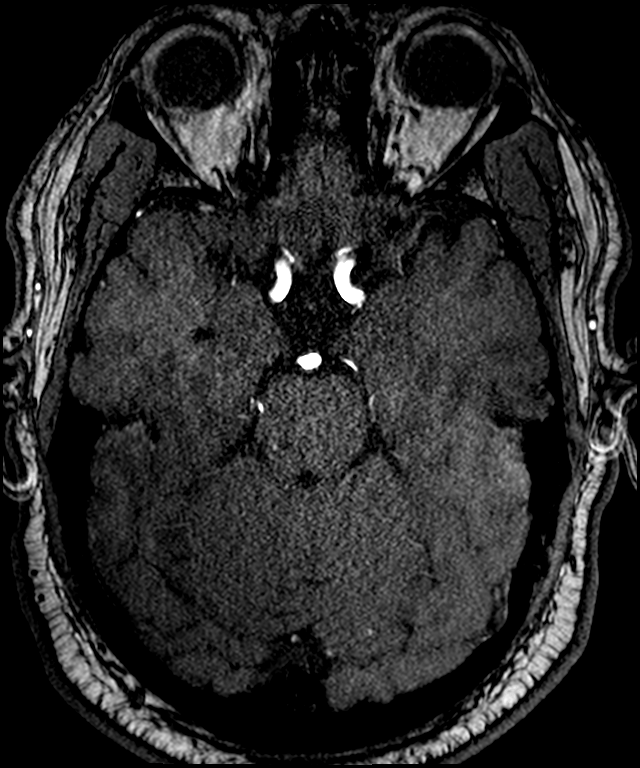
[im 76/149]
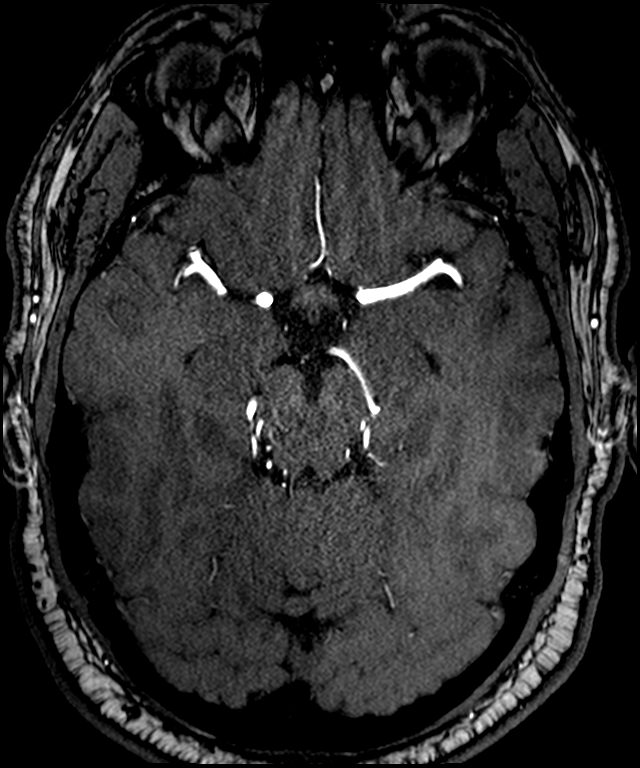
[im 86/149]
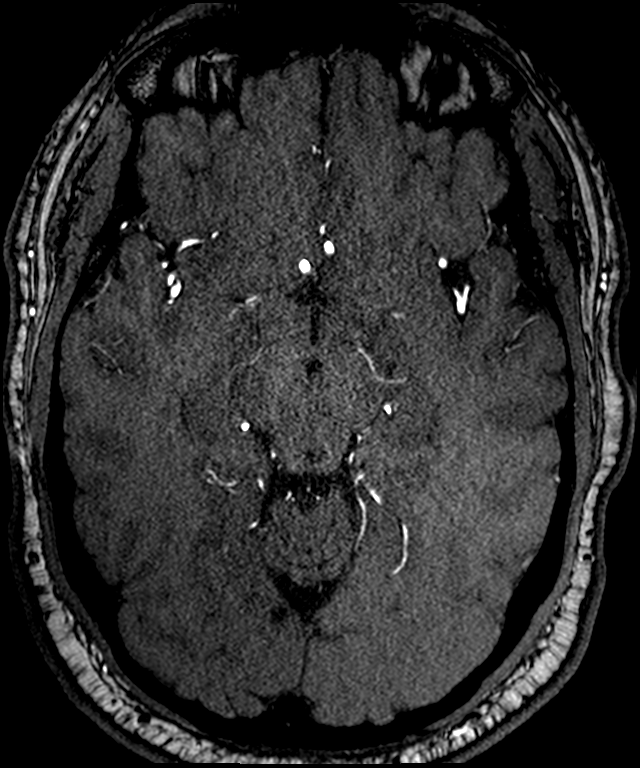
[im 104/149]
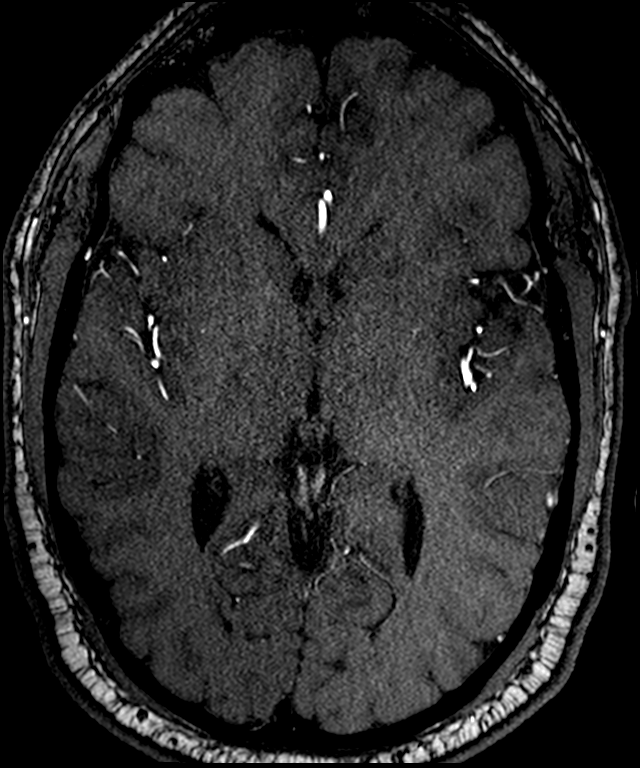
[im 123/149]
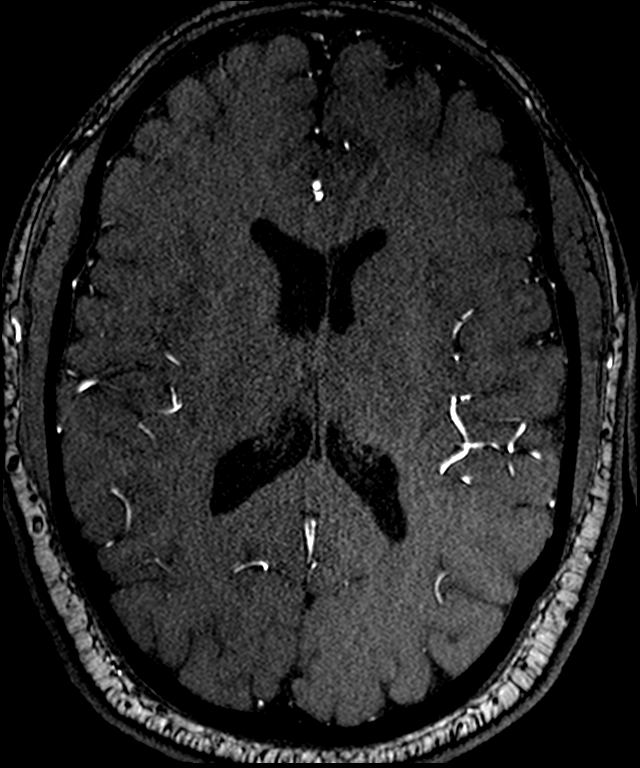
[im 126/149]
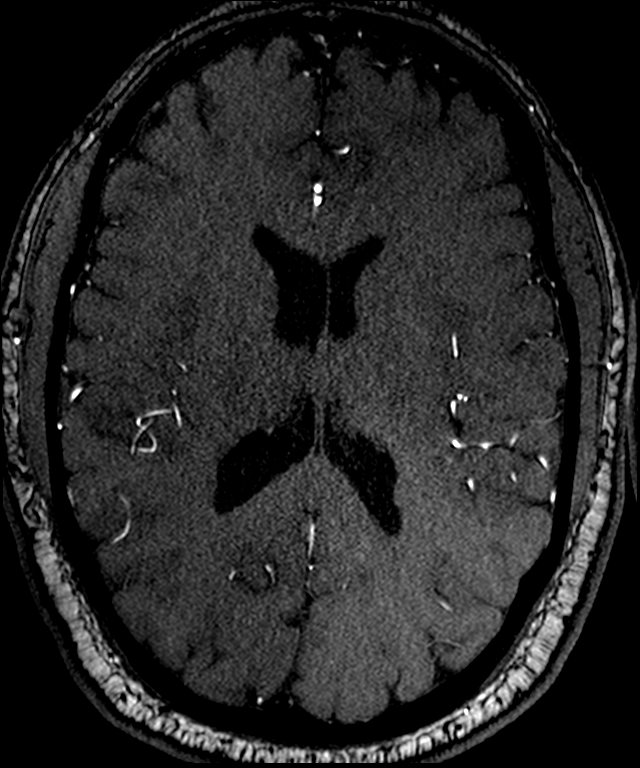
[im 142/149]
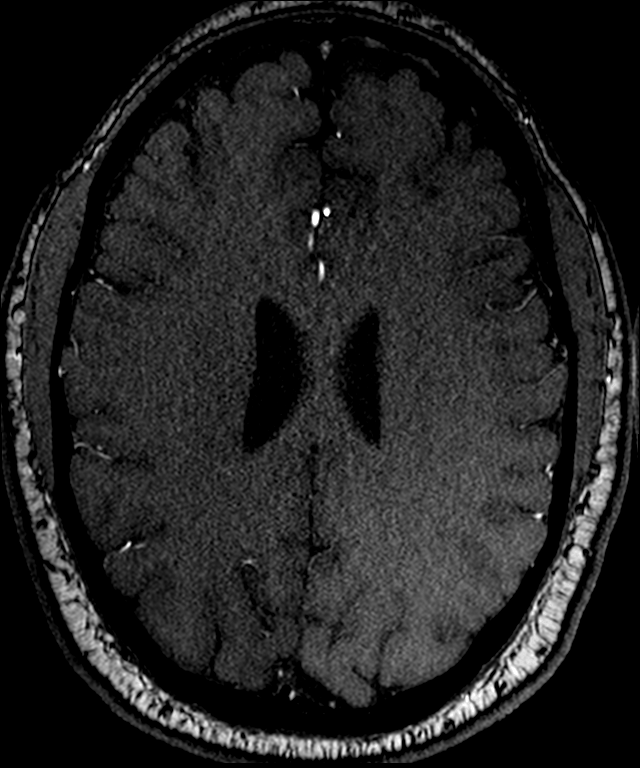

[11 of 48 positions shown; findings below may reference images not displayed]

FINDINGS: Anterior circulation:

The intracranial internal carotid arteries are patent. The M1 middle
cerebral arteries are patent. No M2 proximal branch occlusion or
high-grade proximal stenosis is identified. The anterior cerebral
arteries are patent. No intracranial aneurysm is identified.

Posterior circulation:

The intracranial vertebral arteries are patent. The basilar artery
is patent. The posterior cerebral arteries are patent. Posterior
communicating arteries are diminutive or absent, bilaterally.

Anatomic variants: As described.
IMPRESSION: No intracranial large vessel occlusion or proximal high-grade
arterial stenosis.

No specific cause of pulsatile tinnitus is identified.

## 2022-08-04 ENCOUNTER — Ambulatory Visit
Admission: RE | Admit: 2022-08-04 | Discharge: 2022-08-04 | Disposition: A | Discharge status: Home / Self Care | Payer: 59 | Source: Ambulatory Visit | Attending: Physician Assistant | Admitting: Physician Assistant

## 2022-08-04 DIAGNOSIS — H93A2 Pulsatile tinnitus, left ear: Secondary | ICD-10-CM

## 2022-08-15 ENCOUNTER — Other Ambulatory Visit: Payer: Self-pay | Admitting: Family Medicine

## 2022-08-26 ENCOUNTER — Other Ambulatory Visit: Payer: Self-pay | Admitting: Family Medicine

## 2022-08-26 MED ORDER — VALACYCLOVIR HCL 1 G PO TABS: 1000.0000 mg | ORAL_TABLET | Freq: Every day | ORAL | 0 refills | Status: DC

## 2022-10-17 ENCOUNTER — Ambulatory Visit (INDEPENDENT_AMBULATORY_CARE_PROVIDER_SITE_OTHER): Payer: 59 | Admitting: Family Medicine

## 2022-10-17 ENCOUNTER — Encounter: Payer: Self-pay | Admitting: Family Medicine

## 2022-10-17 VITALS — BP 110/64 | HR 63 | Temp 97.5°F | Ht 70.0 in | Wt 234.5 lb

## 2022-10-17 DIAGNOSIS — E538 Deficiency of other specified B group vitamins: Secondary | ICD-10-CM | POA: Diagnosis not present

## 2022-10-17 DIAGNOSIS — I10 Essential (primary) hypertension: Secondary | ICD-10-CM | POA: Diagnosis not present

## 2022-10-17 DIAGNOSIS — E785 Hyperlipidemia, unspecified: Secondary | ICD-10-CM

## 2022-10-17 DIAGNOSIS — Z0001 Encounter for general adult medical examination with abnormal findings: Secondary | ICD-10-CM

## 2022-10-17 DIAGNOSIS — M109 Gout, unspecified: Secondary | ICD-10-CM

## 2022-10-17 DIAGNOSIS — R739 Hyperglycemia, unspecified: Secondary | ICD-10-CM | POA: Diagnosis not present

## 2022-10-17 DIAGNOSIS — Z114 Encounter for screening for human immunodeficiency virus [HIV]: Secondary | ICD-10-CM

## 2022-10-17 DIAGNOSIS — B001 Herpesviral vesicular dermatitis: Secondary | ICD-10-CM

## 2022-10-17 DIAGNOSIS — Z0000 Encounter for general adult medical examination without abnormal findings: Secondary | ICD-10-CM | POA: Diagnosis not present

## 2022-10-17 DIAGNOSIS — Z1159 Encounter for screening for other viral diseases: Secondary | ICD-10-CM

## 2022-10-17 LAB — CBC WITH DIFFERENTIAL/PLATELET
Basophils Absolute: 0.1 10*3/uL (ref 0.0–0.1)
Basophils Relative: 1.3 % (ref 0.0–3.0)
Eosinophils Absolute: 0.1 10*3/uL (ref 0.0–0.7)
Eosinophils Relative: 1.7 % (ref 0.0–5.0)
HCT: 48.1 % (ref 39.0–52.0)
Hemoglobin: 15.9 g/dL (ref 13.0–17.0)
Lymphocytes Relative: 25.6 % (ref 12.0–46.0)
Lymphs Abs: 2 10*3/uL (ref 0.7–4.0)
MCHC: 33.2 g/dL (ref 30.0–36.0)
MCV: 90 fl (ref 78.0–100.0)
Monocytes Absolute: 0.5 10*3/uL (ref 0.1–1.0)
Monocytes Relative: 6.9 % (ref 3.0–12.0)
Neutro Abs: 5 10*3/uL (ref 1.4–7.7)
Neutrophils Relative %: 64.5 % (ref 43.0–77.0)
Platelets: 277 10*3/uL (ref 150.0–400.0)
RBC: 5.34 Mil/uL (ref 4.22–5.81)
RDW: 14.5 % (ref 11.5–15.5)
WBC: 7.8 10*3/uL (ref 4.0–10.5)

## 2022-10-17 LAB — LIPID PANEL
Cholesterol: 237 mg/dL — ABNORMAL HIGH (ref 0–200)
HDL: 31.3 mg/dL — ABNORMAL LOW (ref >39.00)
NonHDL: 205.44
Total CHOL/HDL Ratio: 8
Triglycerides: 245 mg/dL — ABNORMAL HIGH (ref 0.0–149.0)
VLDL: 49 mg/dL — ABNORMAL HIGH (ref 0.0–40.0)

## 2022-10-17 LAB — COMPREHENSIVE METABOLIC PANEL
ALT: 66 U/L — ABNORMAL HIGH (ref 0–53)
AST: 32 U/L (ref 0–37)
Albumin: 4.6 g/dL (ref 3.5–5.2)
Alkaline Phosphatase: 49 U/L (ref 39–117)
BUN: 15 mg/dL (ref 6–23)
CO2: 25 mEq/L (ref 19–32)
Calcium: 9.8 mg/dL (ref 8.4–10.5)
Chloride: 104 mEq/L (ref 96–112)
Creatinine, Ser: 1.57 mg/dL — ABNORMAL HIGH (ref 0.40–1.50)
GFR: 53.84 mL/min — ABNORMAL LOW (ref >60.00)
Glucose, Bld: 110 mg/dL — ABNORMAL HIGH (ref 70–99)
Potassium: 4.5 mEq/L (ref 3.5–5.1)
Sodium: 139 mEq/L (ref 135–145)
Total Bilirubin: 0.8 mg/dL (ref 0.2–1.2)
Total Protein: 7 g/dL (ref 6.0–8.3)

## 2022-10-17 LAB — TSH: TSH: 3.02 u[IU]/mL (ref 0.35–5.50)

## 2022-10-17 LAB — URIC ACID: Uric Acid, Serum: 9.3 mg/dL — ABNORMAL HIGH (ref 4.0–7.8)

## 2022-10-17 LAB — LDL CHOLESTEROL, DIRECT: Direct LDL: 161 mg/dL

## 2022-10-17 LAB — HEMOGLOBIN A1C: Hgb A1c MFr Bld: 5.8 % (ref 4.6–6.5)

## 2022-10-17 LAB — VITAMIN B12: Vitamin B-12: 277 pg/mL (ref 211–911)

## 2022-10-17 NOTE — Assessment & Plan Note (Addendum)
Down about 7 pounds since last visit.  We did discuss increasing dose of Ozempic however he will continue for now.  He may be interested in bariatric procedure at some point in the future.  He is currently on ozepmic 0.5 mg weekly.  Check A1c today.  He can follow-up with [place] in a few weeks and we can increase the dose as tolerated.

## 2022-10-17 NOTE — Assessment & Plan Note (Signed)
Check lipids. Discussed lifestyle modifications.  

## 2022-10-17 NOTE — Assessment & Plan Note (Signed)
Check uric acid level.  Continue allopurinol 100 mg daily.  No recent flares.

## 2022-10-17 NOTE — Patient Instructions (Signed)
It was very nice to see you today!  We will check blood work today.  Return in about 1 year (around 10/17/2023).   Take care, Dr Jimmey Ralph  PLEASE NOTE:  If you had any lab tests, please let us know if you have not heard back within a few days. You may see your results on mychart before we have a chance to review them but we will give you a call once they are reviewed by [place].   If we ordered any referrals today, please let us know if you have not heard from their office within the next week.   If you had any urgent prescriptions sent in today, please check with the pharmacy within an hour of our visit to make sure the prescription was transmitted appropriately.   Please try these tips to maintain a healthy lifestyle:  Eat at least 3 REAL meals and 1-2 snacks per day.  Aim for no more than 5 hours between eating.  If you eat breakfast, please do so within one hour of getting up.   Each meal should contain half fruits/vegetables, one quarter protein, and one quarter carbs (no bigger than a computer mouse)  Cut down on sweet beverages. This includes juice, soda, and sweet tea.   Drink at least 1 glass of water with each meal and aim for at least 8 glasses per day  Exercise at least 150 minutes every week.    Preventive Care 47-28 Years Old, Male Preventive care refers to lifestyle choices and visits with your health care provider that can promote health and wellness. Preventive care visits are also called wellness exams. What can I expect for my preventive care visit? Counseling During your preventive care visit, your health care provider may ask about your: Medical history, including: Past medical problems. Family medical history. Current health, including: Emotional well-being. Home life and relationship well-being. Sexual activity. Lifestyle, including: Alcohol, nicotine or tobacco, and drug use. Access to firearms. Diet, exercise, and sleep habits. Safety issues such as  seatbelt and bike helmet use. Sunscreen use. Work and work Astronomer. Physical exam Your health care provider will check your: Height and weight. These may be used to calculate your BMI (body mass index). BMI is a measurement that tells if you are at a healthy weight. Waist circumference. This measures the distance around your waistline. This measurement also tells if you are at a healthy weight and may help predict your risk of certain diseases, such as type 2 diabetes and high blood pressure. Heart rate and blood pressure. Body temperature. Skin for abnormal spots. What immunizations do I need?  Vaccines are usually given at various ages, according to a schedule. Your health care provider will recommend vaccines for you based on your age, medical history, and lifestyle or other factors, such as travel or where you work. What tests do I need? Screening Your health care provider may recommend screening tests for certain conditions. This may include: Lipid and cholesterol levels. Diabetes screening. This is done by checking your blood sugar (glucose) after you have not eaten for a while (fasting). Hepatitis B test. Hepatitis C test. HIV (human immunodeficiency virus) test. STI (sexually transmitted infection) testing, if you are at risk. Lung cancer screening. Prostate cancer screening. Colorectal cancer screening. Talk with your health care provider about your test results, treatment options, and if necessary, the need for more tests. Follow these instructions at home: Eating and drinking  Eat a diet that includes fresh fruits and vegetables, whole  grains, lean protein, and low-fat dairy products. Take vitamin and mineral supplements as recommended by your health care provider. Do not drink alcohol if your health care provider tells you not to drink. If you drink alcohol: Limit how much you have to 0-2 drinks a day. Know how much alcohol is in your drink. In the [place] one drink  equals one 12 oz bottle of beer (355 mL), one 5 oz glass of wine (148 mL), or one 1 oz glass of hard liquor (44 mL). Lifestyle Brush your teeth every morning and night with fluoride toothpaste. Floss one time each day. Exercise for at least 30 minutes 5 or more days each week. Do not use any products that contain nicotine or tobacco. These products include cigarettes, chewing tobacco, and vaping devices, such as e-cigarettes. If you need help quitting, ask your health care provider. Do not use drugs. If you are sexually active, practice safe sex. Use a condom or other form of protection to prevent STIs. Take aspirin only as told by your health care provider. Make sure that you understand how much to take and what form to take. Work with your health care provider to find out whether it is safe and beneficial for you to take aspirin daily. Find healthy ways to manage stress, such as: Meditation, yoga, or listening to music. Journaling. Talking to a trusted person. Spending time with friends and family. Minimize exposure to UV radiation to reduce your risk of skin cancer. Safety Always wear your seat belt while driving or riding in a vehicle. Do not drive: If you have been drinking alcohol. Do not ride with someone who has been drinking. When you are tired or distracted. While texting. If you have been using any mind-altering substances or drugs. Wear a helmet and other protective equipment during sports activities. If you have firearms in your house, make sure you follow all gun safety procedures. What's next? Go to your health care provider once a year for an annual wellness visit. Ask your health care provider how often you should have your eyes and teeth checked. Stay up to date on all vaccines. This information is not intended to replace advice given to you by your health care provider. Make sure you discuss any questions you have with your health care provider. Document Revised:  09/30/2020 Document Reviewed: 09/30/2020 Elsevier Patient Education  2024 [employer].

## 2022-10-17 NOTE — Assessment & Plan Note (Signed)
Stable on Valtrex as needed.

## 2022-10-17 NOTE — Assessment & Plan Note (Signed)
Blood pressure at goal today on olmesartan-HCTZ 40-12.5 once daily.  

## 2022-10-17 NOTE — Progress Notes (Signed)
Chief Complaint:  Jose James is a 42 y.o. male who presents today for his annual comprehensive physical exam.    Assessment/Plan:  Chronic Problems Addressed Today: B12 deficiency Check B12.  He has been taking oral supplementation but is not sure if it is effective.  He is interested in maybe going forward with B12 injections if B12 levels are not at goal on oral supplementation.  Hypertension Blood pressure at goal today on olmesartan-HCTZ 40-12.5 once daily.  Dyslipidemia Check lipids.  Discussed lifestyle modifications.  Gout Check uric acid level.  Continue allopurinol 100 mg daily.  No recent flares.  Morbid obesity (HCC) Down about 7 pounds since last visit.  We did discuss increasing dose of Ozempic however he will continue for now.  He may be interested in bariatric procedure at some point in the future.  He is currently on ozepmic 0.5 mg weekly.  Check A1c today.  He can follow-up with [place] in a few weeks and we can increase the dose as tolerated.  Cold sore Stable on Valtrex as needed.  Hyperglycemia On Ozempic 0.5 mg weekly.  Check A1c today.  Preventative Healthcare: Check labs. UpToDate on vaccines.   Patient Counseling(The following topics were reviewed and/or handout was given):  -Nutrition: Stressed importance of moderation in sodium/caffeine intake, saturated fat and cholesterol, caloric balance, sufficient intake of fresh fruits, vegetables, and fiber.  -Stressed the importance of regular exercise.   -Substance Abuse: Discussed cessation/primary prevention of tobacco, alcohol, or other drug use; driving or other dangerous activities under the influence; availability of treatment for abuse.   -Injury prevention: Discussed safety belts, safety helmets, smoke detector, smoking near bedding or upholstery.   -Sexuality: Discussed sexually transmitted diseases, partner selection, use of condoms, avoidance of unintended pregnancy and contraceptive alternatives.    -Dental health: Discussed importance of regular tooth brushing, flossing, and dental visits.  -Health maintenance and immunizations reviewed. Please refer to Health maintenance section.  Return to care in 1 year for next preventative visit.     Subjective:  HPI:  He has no acute complaints today.  See A/P for status of chronic conditions.  Lifestyle Diet: cutting down on portion sizes.  Exercise: Playing pickleball     10/17/2022    7:55 AM  Depression screen PHQ 2/9  Decreased Interest 1  Down, Depressed, Hopeless 0  PHQ - 2 Score 1  Altered sleeping 1  Tired, decreased energy 3  Change in appetite 3  Feeling bad or failure about yourself  0  Trouble concentrating 0  Moving slowly or fidgety/restless 0  Suicidal thoughts 0  PHQ-9 Score 8  Difficult doing work/chores Very difficult    Health Maintenance Due  Topic Date Due   HIV Screening  Never done   Hepatitis C Screening  Never done     ROS: Per HPI, otherwise a complete review of systems was negative.   PMH:  The following were reviewed and entered/updated in epic: Past Medical History:  Diagnosis Date   Allergy    GERD (gastroesophageal reflux disease)    Gout    High cholesterol    Hypertension    Kidney stone    Patient Active Problem List   Diagnosis Date Noted   Dyslipidemia 10/17/2022   Hyperglycemia 10/17/2022   Vertigo 05/19/2022   B12 deficiency 05/19/2022   Morbid obesity (HCC) 01/26/2021   OSA on CPAP 11/02/2020   Shift work sleep disorder 11/02/2020   GERD (gastroesophageal reflux disease)    Pulsatile tinnitus  07/02/2020   Cold sore 07/02/2020   Gout 07/02/2020   Hypertension 12/30/2013   Past Surgical History:  Procedure Laterality Date   TONSILLECTOMY      Family History  Problem Relation Age of Onset   Diabetes Maternal Grandfather    Hypertension Paternal Grandfather     Medications- reviewed and updated Current Outpatient Medications  Medication Sig Dispense Refill    allopurinol (ZYLOPRIM) 100 MG tablet TAKE 1 TABLET(100 MG) BY MOUTH DAILY 90 tablet 3   meclizine (ANTIVERT) 12.5 MG tablet Take 12.5 mg by mouth 3 (three) times daily as needed for dizziness.     meloxicam (MOBIC) 15 MG tablet TAKE 1 TABLET(15 MG) BY MOUTH DAILY AS NEEDED 30 tablet 1   olmesartan-hydrochlorothiazide (BENICAR HCT) 40-12.5 MG tablet Take 1 tablet by mouth daily. 30 tablet 5   ondansetron (ZOFRAN) 4 MG tablet Take 4 mg by mouth every 8 (eight) hours as needed.     Semaglutide,0.25 or 0.5MG /DOS, (OZEMPIC, 0.25 OR 0.5 MG/DOSE,) 2 MG/3ML SOPN Inject 0.5 mg into the skin once a week. 3 mL 1   valACYclovir (VALTREX) 1000 MG tablet Take 1 tablet (1,000 mg total) by mouth daily. (Patient taking differently: Take 1,000 mg by mouth daily as needed.) 90 tablet 0   No current facility-administered medications for this visit.    Allergies-reviewed and updated Allergies  Allergen Reactions   Penicillins Other (See Comments)    Childhood allergy    Social History   Socioeconomic History   Marital status: Divorced    Spouse name: Not on file   Number of children: Not on file   Years of education: Not on file   Highest education level: Not on file  Occupational History   Not on file  Tobacco Use   Smoking status: Never   Smokeless tobacco: Not on file  Vaping Use   Vaping Use: Never used  Substance and Sexual Activity   Alcohol use: Yes    Comment: ocassional   Drug use: No   Sexual activity: Not on file  Other Topics Concern   Not on file  Social History Narrative   Not on file   Social Determinants of Health   Financial Resource Strain: Not on file  Food Insecurity: Not on file  Transportation Needs: Not on file  Physical Activity: Not on file  Stress: Not on file  Social Connections: Not on file        Objective:  Physical Exam: BP 110/64 (BP Location: Left Arm, Patient Position: Sitting, Cuff Size: Large)   Pulse 63   Temp (!) 97.5 F (36.4 C)  (Temporal)   Ht 5\' 10"  (1.778 m)   Wt 234 lb 8 oz (106.4 kg)   SpO2 99%   BMI 33.65 kg/m   Body mass index is 33.65 kg/m. Wt Readings from Last 3 Encounters:  10/17/22 234 lb 8 oz (106.4 kg)  05/19/22 231 lb (104.8 kg)  05/10/22 228 lb 3.2 oz (103.5 kg)   Gen: NAD, resting comfortably HEENT: TMs normal bilaterally. OP clear. No thyromegaly noted.  CV: RRR with no murmurs appreciated Pulm: NWOB, CTAB with no crackles, wheezes, or rhonchi GI: Normal bowel sounds present. Soft, Nontender, Nondistended. MSK: no edema, cyanosis, or clubbing noted Skin: warm, dry Neuro: CN2-12 grossly intact. Strength 5/5 in upper and lower extremities. Reflexes symmetric and intact bilaterally.  Psych: Normal affect and thought content     Jose Kiernan M. Jimmey Ralph, MD 10/17/2022 8:26 AM

## 2022-10-17 NOTE — Assessment & Plan Note (Addendum)
On Ozempic 0.5 mg weekly.  Check A1c today.

## 2022-10-17 NOTE — Assessment & Plan Note (Signed)
Check B12.  He has been taking oral supplementation but is not sure if it is effective.  He is interested in maybe going forward with B12 injections if B12 levels are not at goal on oral supplementation.

## 2022-10-18 LAB — HEPATITIS C ANTIBODY: Hepatitis C Ab: NONREACTIVE

## 2022-10-18 LAB — HIV ANTIBODY (ROUTINE TESTING W REFLEX): HIV 1&2 Ab, 4th Generation: NONREACTIVE

## 2022-10-19 NOTE — Progress Notes (Signed)
His kidney liver numbers are slightly elevated.  This may be due to dehydration.  Recommend he make sure he is getting plenty of fluids and come back to recheck in a week or so.  Please place future order for CMET.  His cholesterol and A1c is elevated.  Do not need to start meds but he should really focus on diet and exercise and we can recheck this in a year or so.  His uric acid level is elevated.  Recommend we increase his allopurinol to 200 mg daily to improve uric acid level and lower risk of gout flares.  Please send in prescription if needed.  We can recheck this again at his next office visit.  The rest of his labs are all stable and we can recheck in a year.

## 2022-10-21 ENCOUNTER — Other Ambulatory Visit: Payer: Self-pay | Admitting: *Deleted

## 2022-10-21 DIAGNOSIS — E860 Dehydration: Secondary | ICD-10-CM

## 2022-11-10 ENCOUNTER — Encounter: Payer: Self-pay | Admitting: Family Medicine

## 2022-11-10 NOTE — Telephone Encounter (Signed)
Please advise 

## 2022-11-14 ENCOUNTER — Other Ambulatory Visit: Payer: Self-pay | Admitting: *Deleted

## 2022-11-14 MED ORDER — SEMAGLUTIDE (1 MG/DOSE) 4 MG/3ML ~~LOC~~ SOPN: 1.0000 mg | PEN_INJECTOR | SUBCUTANEOUS | 1 refills | Status: DC

## 2022-11-14 NOTE — Telephone Encounter (Signed)
Can we clarify what dose he is on? If he is on 0.5 mg weekly the next dose will be 1 mg weekly.  Please send in the 1 mg weekly dose if he is agreeable.  Katina Degree. Jimmey Ralph, MD 11/14/2022 7:25 AM

## 2022-11-14 NOTE — Telephone Encounter (Signed)
Spoke with patient  Patient on Ozempic 0.5mg  new Rx Ozempic 1mg  send to his pharmacy  Patient aware

## 2022-12-02 ENCOUNTER — Encounter: Payer: Self-pay | Admitting: Family Medicine

## 2022-12-02 ENCOUNTER — Other Ambulatory Visit: Payer: Self-pay | Admitting: *Deleted

## 2022-12-02 MED ORDER — ALLOPURINOL 100 MG PO TABS: 100.0000 mg | ORAL_TABLET | Freq: Two times a day (BID) | ORAL | 1 refills | Status: DC

## 2022-12-02 NOTE — Telephone Encounter (Signed)
Medication send to pharmacy, patient notified

## 2022-12-05 ENCOUNTER — Other Ambulatory Visit (INDEPENDENT_AMBULATORY_CARE_PROVIDER_SITE_OTHER): Payer: 59

## 2022-12-05 DIAGNOSIS — E860 Dehydration: Secondary | ICD-10-CM

## 2022-12-05 LAB — COMPREHENSIVE METABOLIC PANEL
ALT: 57 U/L — ABNORMAL HIGH (ref 0–53)
AST: 27 U/L (ref 0–37)
Albumin: 4.2 g/dL (ref 3.5–5.2)
Alkaline Phosphatase: 45 U/L (ref 39–117)
BUN: 24 mg/dL — ABNORMAL HIGH (ref 6–23)
CO2: 25 mEq/L (ref 19–32)
Calcium: 9.2 mg/dL (ref 8.4–10.5)
Chloride: 105 mEq/L (ref 96–112)
Creatinine, Ser: 1.52 mg/dL — ABNORMAL HIGH (ref 0.40–1.50)
GFR: 55.92 mL/min — ABNORMAL LOW (ref >60.00)
Glucose, Bld: 104 mg/dL — ABNORMAL HIGH (ref 70–99)
Potassium: 4.3 mEq/L (ref 3.5–5.1)
Sodium: 138 mEq/L (ref 135–145)
Total Bilirubin: 0.9 mg/dL (ref 0.2–1.2)
Total Protein: 6.1 g/dL (ref 6.0–8.3)

## 2022-12-06 NOTE — Progress Notes (Signed)
He still has some mild elevations in his kidney numbers and liver numbers.  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  Recommend referral to nephrology to further assess his kidney function  For his liver numbers, recommend right upper quadrant ultrasound for further evaluation. Please place order.  Katina Degree. Jimmey Ralph, MD 12/06/2022 10:11 AM

## 2022-12-07 ENCOUNTER — Other Ambulatory Visit: Payer: Self-pay | Admitting: *Deleted

## 2022-12-07 DIAGNOSIS — R799 Abnormal finding of blood chemistry, unspecified: Secondary | ICD-10-CM

## 2022-12-12 ENCOUNTER — Ambulatory Visit
Admission: RE | Admit: 2022-12-12 | Discharge: 2022-12-12 | Disposition: A | Discharge status: Home / Self Care | Payer: 59 | Source: Ambulatory Visit | Attending: Family Medicine | Admitting: Family Medicine

## 2022-12-12 DIAGNOSIS — R799 Abnormal finding of blood chemistry, unspecified: Secondary | ICD-10-CM

## 2022-12-13 ENCOUNTER — Other Ambulatory Visit (HOSPITAL_COMMUNITY): Payer: Self-pay

## 2022-12-14 ENCOUNTER — Encounter: Payer: Self-pay | Admitting: Family Medicine

## 2022-12-14 NOTE — Progress Notes (Signed)
His ultrasound shows that he has mild fatty liver disease.  This is a benign condition but he should continue to work on weight loss via diet and exercise.  We can continue to monitor his liver numbers annually.  Do not need to do any other testing at this point.

## 2022-12-20 NOTE — Telephone Encounter (Signed)
He did have some possible kidney stones but this is not the best test to evaluate for this. Typically this is asymptomatic but if he is having pain we should get a non contrast abdominal CT scan.  Katina Degree. Jose Ralph, MD 12/20/2022 2:18 PM

## 2022-12-20 NOTE — Telephone Encounter (Signed)
Please advise 

## 2023-01-27 ENCOUNTER — Encounter: Payer: Self-pay | Admitting: Family Medicine

## 2023-01-27 ENCOUNTER — Telehealth: Payer: Self-pay | Admitting: *Deleted

## 2023-01-27 NOTE — Telephone Encounter (Signed)
(  Jose James) - [id number] Jose James (1 MG/DOSE) 4MG Jose James pen-injectors status: PA RequestCreated: October 11th, 2024 [phone]: October 11th, 2024 Waiting for determination

## 2023-01-27 NOTE — Telephone Encounter (Signed)
Request Reference Number: [id number]. OZEMPIC INJ 4MG /3ML is denied for not meeting the prior authorization requirement(s)  Patient notified,advise to contact insurance for alternative medication

## 2023-01-27 NOTE — Telephone Encounter (Signed)
Patient aware Rx Ozempic denied by insurance

## 2023-01-29 NOTE — Progress Notes (Signed)
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  Weight loss should help [place] with his sleep apnea management. Download reviewed.  Discussed travel machines.  Discussed nasal saline gel for dryness.  Good start on CPAP.  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 Sleeps better with CPAP. Also has travel CPAP used when on call for work.  ROS-see HPI   + = positive Constitutional:    weight loss, night sweats, fevers, chills, fatigue, lassitude. HEENT:    headaches, +difficulty swallowing, tooth/dental problems, sore throat,       sneezing, itching, ear ache, nasal congestion, post nasal drip, snoring CV:    chest pain, orthopnea, PND, swelling in lower extremities, anasarca,     dizziness, palpitations Resp:   shortness of breath with exertion or at rest.                productive cough,   non-productive cough, coughing up of blood.               change in color of mucus.  wheezing.   Skin:    rash or lesions. GI:+ heartburn, indigestion, abdominal pain, nausea, vomiting, diarrhea,                 change in bowel habits, loss of appetite GU: dysuria, change in color of urine, no urgency or frequency.   flank pain. MS:   joint pain, stiffness, decreased range of motion, back pain. Neuro-     nothing unusual Psych:  change in mood or affect.  depression or anxiety.   memory loss.  OBJ- Physical Exam General- Alert, Oriented, Affect-appropriate, Distress- none acute, +muscular Skin- rash-none, lesions- none, excoriation- none Lymphadenopathy- none Head- atraumatic            Eyes- Gross vision intact, PERRLA, conjunctivae and secretions clear            Ears- Hearing, canals-normal            Nose- Clear, no-Septal dev, mucus, polyps, erosion, perforation             Throat- Mallampati IV, mucosa clear , drainage- none, tonsils+absent,  + teeth Neck- flexible , trachea midline, no stridor , thyroid nl, carotid no bruit Chest - symmetrical excursion , unlabored           Heart/CV- RRR , no murmur , no gallop  , no rub, nl s1 s2                           -  JVD- none , edema- none, stasis changes- none, varices- none           Lung- clear to P&A, wheeze- none, cough- none , dullness-none, rub- none           Chest wall-  Abd-  Br/ Gen/ Rectal- Not done, not indicated Extrem- cyanosis- none, clubbing, none, atrophy- none, strength- nl Neuro- grossly intact to observation

## 2023-01-31 ENCOUNTER — Encounter: Payer: Self-pay | Admitting: Internal Medicine

## 2023-01-31 ENCOUNTER — Ambulatory Visit: Payer: 59 | Admitting: Internal Medicine

## 2023-01-31 ENCOUNTER — Encounter: Payer: 59 | Admitting: Family Medicine

## 2023-01-31 VITALS — BP 123/86 | HR 66 | Ht 70.0 in | Wt 232.6 lb

## 2023-01-31 DIAGNOSIS — G4726 Circadian rhythm sleep disorder, shift work type: Secondary | ICD-10-CM

## 2023-01-31 DIAGNOSIS — G4733 Obstructive sleep apnea (adult) (pediatric): Secondary | ICD-10-CM | POA: Diagnosis not present

## 2023-01-31 NOTE — Patient Instructions (Signed)
We can continue CPAP auto 4-20. If you can reconnect the WiFi that would be great.  Please call if we can help

## 2023-01-31 NOTE — Telephone Encounter (Signed)
Please advise or would you like patient to schedule to discuss next steps/options

## 2023-01-31 NOTE — Telephone Encounter (Signed)
He can check with insurance to see if they will cover Wegovy or Zepbound.  There are no other comparable OTC medications.  Katina Degree. Jimmey Ralph, MD 01/31/2023 3:16 PM

## 2023-02-19 ENCOUNTER — Encounter: Payer: Self-pay | Admitting: Internal Medicine

## 2023-02-19 NOTE — Assessment & Plan Note (Signed)
Benefits from CPAP Plan- continue auto 4-20

## 2023-02-19 NOTE — Assessment & Plan Note (Signed)
Weighs 20 lbs less than at time of sleep study.

## 2023-02-19 NOTE — Assessment & Plan Note (Signed)
Again reviewed sleep adjustments

## 2023-06-09 ENCOUNTER — Other Ambulatory Visit: Payer: Self-pay | Admitting: Family

## 2023-06-09 ENCOUNTER — Encounter: Payer: Self-pay | Admitting: Family Medicine

## 2023-06-09 DIAGNOSIS — I10 Essential (primary) hypertension: Secondary | ICD-10-CM

## 2023-06-09 MED ORDER — OLMESARTAN MEDOXOMIL-HCTZ 40-12.5 MG PO TABS: 1.0000 | ORAL_TABLET | Freq: Every day | ORAL | 5 refills | Status: DC

## 2023-06-15 ENCOUNTER — Ambulatory Visit: Payer: 59 | Admitting: Family Medicine

## 2023-06-15 ENCOUNTER — Encounter: Payer: Self-pay | Admitting: Family Medicine

## 2023-06-15 VITALS — BP 115/71 | HR 62 | Temp 97.7°F | Ht 70.0 in | Wt 243.4 lb

## 2023-06-15 DIAGNOSIS — E785 Hyperlipidemia, unspecified: Secondary | ICD-10-CM

## 2023-06-15 DIAGNOSIS — E538 Deficiency of other specified B group vitamins: Secondary | ICD-10-CM

## 2023-06-15 DIAGNOSIS — I10 Essential (primary) hypertension: Secondary | ICD-10-CM | POA: Diagnosis not present

## 2023-06-15 DIAGNOSIS — R739 Hyperglycemia, unspecified: Secondary | ICD-10-CM | POA: Diagnosis not present

## 2023-06-15 LAB — CBC
HCT: 43.6 % (ref 39.0–52.0)
Hemoglobin: 14.6 g/dL (ref 13.0–17.0)
MCHC: 33.6 g/dL (ref 30.0–36.0)
MCV: 90.6 fl (ref 78.0–100.0)
Platelets: 264 10*3/uL (ref 150.0–400.0)
RBC: 4.81 Mil/uL (ref 4.22–5.81)
RDW: 14.6 % (ref 11.5–15.5)
WBC: 7 10*3/uL (ref 4.0–10.5)

## 2023-06-15 LAB — LIPID PANEL
Cholesterol: 231 mg/dL — ABNORMAL HIGH (ref 0–200)
HDL: 34.3 mg/dL — ABNORMAL LOW (ref >39.00)
LDL Cholesterol: 151 mg/dL — ABNORMAL HIGH (ref 0–99)
NonHDL: 197.12
Total CHOL/HDL Ratio: 7
Triglycerides: 233 mg/dL — ABNORMAL HIGH (ref 0.0–149.0)
VLDL: 46.6 mg/dL — ABNORMAL HIGH (ref 0.0–40.0)

## 2023-06-15 LAB — COMPREHENSIVE METABOLIC PANEL
ALT: 112 U/L — ABNORMAL HIGH (ref 0–53)
AST: 244 U/L — ABNORMAL HIGH (ref 0–37)
Albumin: 4.2 g/dL (ref 3.5–5.2)
Alkaline Phosphatase: 45 U/L (ref 39–117)
BUN: 23 mg/dL (ref 6–23)
CO2: 27 meq/L (ref 19–32)
Calcium: 9.2 mg/dL (ref 8.4–10.5)
Chloride: 106 meq/L (ref 96–112)
Creatinine, Ser: 1.42 mg/dL (ref 0.40–1.50)
GFR: 60.45 mL/min (ref >60.00)
Glucose, Bld: 114 mg/dL — ABNORMAL HIGH (ref 70–99)
Potassium: 4.4 meq/L (ref 3.5–5.1)
Sodium: 139 meq/L (ref 135–145)
Total Bilirubin: 0.9 mg/dL (ref 0.2–1.2)
Total Protein: 6.3 g/dL (ref 6.0–8.3)

## 2023-06-15 LAB — TSH: TSH: 3.51 u[IU]/mL (ref 0.35–5.50)

## 2023-06-15 LAB — TESTOSTERONE: Testosterone: 299.67 ng/dL — ABNORMAL LOW (ref 300.00–890.00)

## 2023-06-15 LAB — HEMOGLOBIN A1C: Hgb A1c MFr Bld: 6 % (ref 4.6–6.5)

## 2023-06-15 LAB — VITAMIN B12: Vitamin B-12: 227 pg/mL (ref 211–911)

## 2023-06-15 MED ORDER — OLMESARTAN MEDOXOMIL-HCTZ 40-12.5 MG PO TABS: 1.0000 | ORAL_TABLET | Freq: Every day | ORAL | 3 refills | Status: AC

## 2023-06-15 NOTE — Patient Instructions (Signed)
 It was very nice to see you today!  We will refill your medications today.   I will recheck your blood work today   Return in about 1 year (around 06/14/2024) for Annual Physical.   Take care, Dr Jimmey Ralph  PLEASE NOTE:  If you had any lab tests, please let us know if you have not heard back within a few days. You may see your results on mychart before we have a chance to review them but we will give you a call once they are reviewed by [place].   If we ordered any referrals today, please let us know if you have not heard from their office within the next week.   If you had any urgent prescriptions sent in today, please check with the pharmacy within an hour of our visit to make sure the prescription was transmitted appropriately.   Please try these tips to maintain a healthy lifestyle:  Eat at least 3 REAL meals and 1-2 snacks per day.  Aim for no more than 5 hours between eating.  If you eat breakfast, please do so within one hour of getting up.   Each meal should contain half fruits/vegetables, one quarter protein, and one quarter carbs (no bigger than a computer mouse)  Cut down on sweet beverages. This includes juice, soda, and sweet tea.   Drink at least 1 glass of water with each meal and aim for at least 8 glasses per day  Exercise at least 150 minutes every week.

## 2023-06-15 NOTE — Progress Notes (Signed)
   Jose James is a 43 y.o. male who presents today for an office visit.  Assessment/Plan:  Chronic Problems Addressed Today: Hypertension Blood pressure at goal olmesartan hydrochlorothiazide 40-12.5 once daily.  Tolerating well.  Check labs.  B12 deficiency Check B12.   Dyslipidemia Check lipids.   Hyperglycemia Check A1c.  Discussed lifestyle modifications.  Morbid obesity (HCC) He did lose about 20 pounds on semaglutide however has regained this since stopping.  Insurance would not pay for this.  We did discuss alternative such as Qsymia however does not appear as if insurance will pay for this either.  Will check labs today.  We discussed lifestyle modifications.     Subjective:  HPI:  See Assessment / plan for status of chronic conditions. Patient here today for follow up BP. He has been consistent with olmesartan 40-12.5.  Blood pressures have been at goal.  He is tolerating well.  Needs refill today.  He would like to have blood work done today.  Insurance will no longer pay for his semaglutide.  He has been working on diet and exercise.       Objective:  Physical Exam: BP 115/71   Pulse 62   Temp 97.7 F (36.5 C) (Temporal)   Ht 5\' 10"  (1.778 m)   Wt 243 lb 6.4 oz (110.4 kg)   SpO2 98%   BMI 34.92 kg/m   Wt Readings from Last 3 Encounters:  06/15/23 243 lb 6.4 oz (110.4 kg)  01/31/23 232 lb 9.6 oz (105.5 kg)  10/17/22 234 lb 8 oz (106.4 kg)    Gen: No acute distress, resting comfortably CV: Regular rate and rhythm with no murmurs appreciated Pulm: Normal work of breathing, clear to auscultation bilaterally with no crackles, wheezes, or rhonchi Neuro: Grossly normal, moves all extremities Psych: Normal affect and thought content      Maxxon Schwanke M. Jimmey Ralph, MD 06/15/2023 8:16 AM

## 2023-06-15 NOTE — Assessment & Plan Note (Signed)
 Check lipids

## 2023-06-15 NOTE — Assessment & Plan Note (Addendum)
 He did lose about 20 pounds on semaglutide however has regained this since stopping.  Insurance would not pay for this.  We did discuss alternative such as Qsymia however does not appear as if insurance will pay for this either.  Will check labs today.  We discussed lifestyle modifications.

## 2023-06-15 NOTE — Assessment & Plan Note (Signed)
 Blood pressure at goal olmesartan hydrochlorothiazide 40-12.5 once daily.  Tolerating well.  Check labs.

## 2023-06-15 NOTE — Assessment & Plan Note (Signed)
 Check B12

## 2023-06-15 NOTE — Assessment & Plan Note (Signed)
 Check A1c.  Discussed lifestyle modifications.

## 2023-06-16 ENCOUNTER — Other Ambulatory Visit: Payer: Self-pay | Admitting: *Deleted

## 2023-06-16 ENCOUNTER — Encounter: Payer: Self-pay | Admitting: Family Medicine

## 2023-06-16 DIAGNOSIS — R7989 Other specified abnormal findings of blood chemistry: Secondary | ICD-10-CM

## 2023-06-16 DIAGNOSIS — R748 Abnormal levels of other serum enzymes: Secondary | ICD-10-CM

## 2023-06-16 NOTE — Progress Notes (Signed)
 His liver numbers are elevated.  Please have him come back today or early next week to recheck c-Met.  His cholesterol levels are elevated.  Do not recommend starting statin at this point however he needs to continue to work on diet and exercise and we can recheck this again in a year.  His testosterone is very slightly decreased.  We can check this again when he comes back in.  Please place future order for testosterone.  Blood sugar is borderline elevated but stable compared to previous values.  Do not need to start meds for this however he should continue to work on diet and exercise as above.  His B12 is on the lower side of normal.  It would be a good idea for him to take a B12 supplement 1000 mcg daily.  We can recheck in 3 to 6 months.

## 2023-06-19 NOTE — Telephone Encounter (Signed)
 See note

## 2023-06-20 NOTE — Telephone Encounter (Signed)
 His liver numbers are probably due to the fatty liver however given the significant increase I would like to make sure that they are stable.  Please make sure that he comes back soon for the CMET.   For his age group we typically would not start a statin unless his LDL is greater than 190.  There may be benefits to starting prior to this.  He can come in to discuss pros and cons of this if he wishes.

## 2023-06-21 ENCOUNTER — Other Ambulatory Visit: Payer: Self-pay | Admitting: *Deleted

## 2023-06-30 ENCOUNTER — Other Ambulatory Visit

## 2023-07-04 ENCOUNTER — Other Ambulatory Visit

## 2023-08-17 ENCOUNTER — Encounter: Payer: Self-pay | Admitting: Family

## 2023-08-17 ENCOUNTER — Ambulatory Visit: Admitting: Family

## 2023-08-17 VITALS — BP 129/85 | HR 69 | Temp 97.7°F | Ht 70.0 in | Wt 239.4 lb

## 2023-08-17 DIAGNOSIS — R0982 Postnasal drip: Secondary | ICD-10-CM

## 2023-08-17 DIAGNOSIS — J4531 Mild persistent asthma with (acute) exacerbation: Secondary | ICD-10-CM | POA: Diagnosis not present

## 2023-08-17 NOTE — Progress Notes (Signed)
 Patient ID: Jose James, male    DOB: [birth year], 43 y.o.   MRN: [id number]  Chief Complaint  Patient presents with   Cough    Pt c/o cough, nasal congestion, wheezing and SOB. Present for 2 weeks. Seen Tele doc, doxycyline, prednisone, tessalon and  Discussed the use of AI scribe software for clinical note transcription with the patient, who gave verbal consent to proceed.  History of Present Illness Jose James is a 43 year old male with seasonal asthma who presents with persistent cough and congestion.  He experiences persistent cough, congestion, runny nose, and itchy eyes for a couple of weeks. Zyrtec has been ineffective over the past week. Flonase, started last night, provides slight relief, but symptoms persist. An episode of uncontrollable coughing during a call required the use of his albuterol inhaler. Symptoms worsen when lying down, causing difficulty breathing and chest tightness. He uses an adjustable bed to alleviate these symptoms at night. He recently completed a course of prednisone and Doxycycline without significant improvement. He is concerned about managing symptoms before an upcoming trip in May. He denies sore throat but has a scratchy feeling from coughing. He experiences a headache when coughing but denies continuous headaches or facial pressure/pain. He uses only his rescue inhaler for asthma symptoms. Current medications include Zyrtec, Flonase, and an albuterol inhaler. He recently completed a course of prednisone.  Assessment & Plan Asthma, allergy induced - Lungs w/mild wheezing, coarse. Asthma exacerbated by allergens with cough and chest tightness. Breztri inhaler prescribed for inflammation and mucus control. Recently on prednisone, DOXY, and cough pearles without relief. - Use Breztri inhaler, two puffs twice daily, sample provided and demonstrated. - Continue short-acting albuterol inhaler as needed. - Increase water intake. - Monitor for fever or  changes in mucus color indicating possible infection.  Postnasal drip - Persistent symptoms due to seasonal allergens. Flonase started yesterday with slight improvement. No bacterial infection present. - Continue Flonase, one spray twice daily for a few days, then reduce to once daily. - Consider adding oral antihistamines like generic Claritin or Zyrtec if symptoms persist. - Monitor for symptom recurrence and initiate treatment at first sign of symptoms. - Call office back if sx are not improving.   Subjective:    Outpatient Medications Prior to Visit  Medication Sig Dispense Refill   albuterol (VENTOLIN HFA) 108 (90 Base) MCG/ACT inhaler SMARTSIG:2 Puff(s) By Mouth Every 4-6 Hours PRN     allopurinol  (ZYLOPRIM ) 100 MG tablet Take 1 tablet (100 mg total) by mouth 2 (two) times daily. 180 tablet 1   meloxicam  (MOBIC ) 15 MG tablet TAKE 1 TABLET(15 MG) BY MOUTH DAILY AS NEEDED 30 tablet 1   olmesartan -hydrochlorothiazide (BENICAR  HCT) 40-12.5 MG tablet Take 1 tablet by mouth daily. 90 tablet 3   valACYclovir  (VALTREX ) 1000 MG tablet Take 1 tablet (1,000 mg total) by mouth daily. (Patient taking differently: Take 1,000 mg by mouth daily as needed.) 90 tablet 0   meclizine (ANTIVERT) 12.5 MG tablet Take 12.5 mg by mouth 3 (three) times daily as needed for dizziness. (Patient not taking: Reported on 08/17/2023)     No facility-administered medications prior to visit.   Past Medical History:  Diagnosis Date   Allergy    GERD (gastroesophageal reflux disease)    Gout    High cholesterol    Hypertension    Kidney stone    Past Surgical History:  Procedure Laterality Date   TONSILLECTOMY     Allergies  Allergen Reactions  Penicillins Other (See Comments)    Childhood allergy      Objective:    Physical Exam Vitals and nursing note reviewed.  Constitutional:      General: He is not in acute distress.    Appearance: Normal appearance.  HENT:     Head: Normocephalic.     Right  Ear: Tympanic membrane and ear canal normal.     Left Ear: Tympanic membrane and ear canal normal.     Nose: No congestion or rhinorrhea.     Right Sinus: No maxillary sinus tenderness or frontal sinus tenderness.     Left Sinus: No maxillary sinus tenderness or frontal sinus tenderness.     Mouth/Throat:     Mouth: Mucous membranes are moist.     Pharynx: Postnasal drip present. No pharyngeal swelling, oropharyngeal exudate, posterior oropharyngeal erythema or uvula swelling.     Tonsils: No tonsillar exudate or tonsillar abscesses.  Cardiovascular:     Rate and Rhythm: Normal rate and regular rhythm.  Pulmonary:     Effort: Pulmonary effort is normal.     Breath sounds: Examination of the right-middle field reveals wheezing. Examination of the left-middle field reveals wheezing. Wheezing (mild, coarse) present.  Musculoskeletal:        General: Normal range of motion.     Cervical back: Normal range of motion.  Lymphadenopathy:     Head:     Right side of head: No preauricular or posterior auricular adenopathy.     Left side of head: No preauricular or posterior auricular adenopathy.     Cervical: No cervical adenopathy.  Skin:    General: Skin is warm and dry.  Neurological:     Mental Status: He is alert and oriented to person, place, and time.  Psychiatric:        Mood and Affect: Mood normal.    BP 129/85 (BP Location: Left Arm, Patient Position: Sitting, Cuff Size: Large)   Pulse 69   Temp 97.7 F (36.5 C) (Temporal)   Ht 5\' 10"  (1.778 m)   Wt 239 lb 6.4 oz (108.6 kg)   SpO2 99%   BMI 34.35 kg/m  Wt Readings from Last 3 Encounters:  08/17/23 239 lb 6.4 oz (108.6 kg)  06/15/23 243 lb 6.4 oz (110.4 kg)  01/31/23 232 lb 9.6 oz (105.5 kg)      Versa Gore, NP

## 2023-08-18 ENCOUNTER — Encounter: Payer: Self-pay | Admitting: Family Medicine

## 2023-08-21 ENCOUNTER — Other Ambulatory Visit: Payer: Self-pay

## 2023-08-21 DIAGNOSIS — M109 Gout, unspecified: Secondary | ICD-10-CM

## 2023-08-21 MED ORDER — ALLOPURINOL 100 MG PO TABS: 100.0000 mg | ORAL_TABLET | Freq: Two times a day (BID) | ORAL | 1 refills | Status: DC

## 2023-09-05 ENCOUNTER — Other Ambulatory Visit: Payer: Self-pay | Admitting: *Deleted

## 2023-09-05 MED ORDER — VALACYCLOVIR HCL 1 G PO TABS: 1000.0000 mg | ORAL_TABLET | Freq: Every day | ORAL | 0 refills | Status: AC

## 2023-09-25 ENCOUNTER — Telehealth: Payer: Self-pay | Admitting: Family Medicine

## 2023-09-25 NOTE — Telephone Encounter (Signed)
 Patient dropped off document Pre-Participation Physical Form , to be filled out by provider. Patient requested to send it back via Call Patient to pick up within 5-days. Document is located in providers tray at front office.Please advise at Mobile [phone] (mobile)

## 2023-09-27 NOTE — Telephone Encounter (Signed)
 Pt agreed to schedule cpe. Will schedule MyChart when he has a chance

## 2023-09-27 NOTE — Telephone Encounter (Signed)
 Patient need CPE to CPE form be done

## 2023-10-16 ENCOUNTER — Encounter: Payer: Self-pay | Admitting: Family Medicine

## 2023-10-16 ENCOUNTER — Ambulatory Visit: Admitting: Family Medicine

## 2023-10-16 VITALS — BP 111/75 | HR 62 | Temp 97.7°F | Ht 70.0 in | Wt 242.0 lb

## 2023-10-16 DIAGNOSIS — I10 Essential (primary) hypertension: Secondary | ICD-10-CM

## 2023-10-16 DIAGNOSIS — M545 Low back pain, unspecified: Secondary | ICD-10-CM | POA: Diagnosis not present

## 2023-10-16 DIAGNOSIS — K219 Gastro-esophageal reflux disease without esophagitis: Secondary | ICD-10-CM | POA: Diagnosis not present

## 2023-10-16 LAB — POCT URINALYSIS DIPSTICK
Bilirubin, UA: NEGATIVE
Blood, UA: NEGATIVE
Glucose, UA: NEGATIVE
Ketones, UA: NEGATIVE
Leukocytes, UA: NEGATIVE
Nitrite, UA: NEGATIVE
Protein, UA: NEGATIVE
Spec Grav, UA: 1.02 (ref 1.010–1.025)
Urobilinogen, UA: 0.2 U/dL
pH, UA: 6 (ref 5.0–8.0)

## 2023-10-16 MED ORDER — PREDNISONE 50 MG PO TABS: ORAL_TABLET | ORAL | 0 refills | Status: DC

## 2023-10-16 MED ORDER — PANTOPRAZOLE SODIUM 40 MG PO TBEC
40.0000 mg | DELAYED_RELEASE_TABLET | Freq: Every day | ORAL | 3 refills | Status: AC
Start: 2023-10-16 — End: ?

## 2023-10-16 MED ORDER — CYCLOBENZAPRINE HCL 10 MG PO TABS
10.0000 mg | ORAL_TABLET | Freq: Three times a day (TID) | ORAL | 0 refills | Status: AC | PRN
Start: 2023-10-16 — End: ?

## 2023-10-16 NOTE — Patient Instructions (Signed)
 It was very nice to see you today!  I think you have a muscular strain in your back.  Work on the stretches.  Start the prednisone and Flexeril.  Let us  know if not improving in 1 to 2 weeks.  You may have inflammation in your stomach.  Please start the Protonix.  Let us  know if not proving in a couple weeks.  Return if symptoms worsen or fail to improve.   Take care, Dr Kennyth  PLEASE NOTE:  If you had any lab tests, please let us  know if you have not heard back within a few days. You may see your results on mychart before we have a chance to review them but we will give you a call once they are reviewed by us .   If we ordered any referrals today, please let us  know if you have not heard from their office within the next week.   If you had any urgent prescriptions sent in today, please check with the pharmacy within an hour of our visit to make sure the prescription was transmitted appropriately.   Please try these tips to maintain a healthy lifestyle:  Eat at least 3 REAL meals and 1-2 snacks per day.  Aim for no more than 5 hours between eating.  If you eat breakfast, please do so within one hour of getting up.   Each meal should contain half fruits/vegetables, one quarter protein, and one quarter carbs (no bigger than a computer mouse)  Cut down on sweet beverages. This includes juice, soda, and sweet tea.   Drink at least 1 glass of water with each meal and aim for at least 8 glasses per day  Exercise at least 150 minutes every week.

## 2023-10-16 NOTE — Assessment & Plan Note (Signed)
 At goal today on olmesartan  HCTZ 40-12.5 once daily.

## 2023-10-16 NOTE — Progress Notes (Signed)
   Jose James is a 43 y.o. male who presents today for an office visit.  Assessment/Plan:  New/Acute Problems: Left Back Pain  Consistent with muscular strain.  Pain reproducible on exam with palpation.  Due to concern for possible gastritis or PUD we will start prednisone 50 mg daily.  Also start Flexeril.  Also discussed home exercise program and handout was given.  UA negative - doubt stone or urinary tract infection. Given length of symptoms we did discuss further evaluation including imaging and referral today however we will try above conservative measures first.  He will let us  know if no improvement next 2 weeks and will refer to sports medicine at that time.  Left upper quadrant abdominal pain No red flags.  Reassuring exam.  May have developed gastritis or PUD from recent NSAID use.  Will start Protonix 40 mg daily for the next few weeks.  He will let us  know if not improving and would consider imaging versus referral to GI at that time.  Chronic Problems Addressed Today: GERD (gastroesophageal reflux disease) Worsened recently and may be contributing to his above LUQ pain.  Will try Protonix for a couple of weeks.  If no improvement will need referral to GI versus imaging.  Hypertension At goal today on olmesartan  HCTZ 40-12.5 once daily.  Encounter for form completion Form for scout trip completed today.    Subjective:  HPI:  See Assessment / plan for status of chronic conditions. He is here today with left lower back pain. This has been intermittent for years but started getting worse a few days while laying in bed. Feels like there is a knot in his back. He does have some worsening pressure after eating. Tried meloxicam  without much improvement. No other treatments tried. No constipation or diarrhea.  He has noticed more abdominal pressure in his left upper quadrant.  He is concerned about possible ulcer.  He has been taking meloxicam  intermittently though in the past  has taken quite a bit of NSAIDs.  No nausea or vomiting.  No melena or hematochezia.       Objective:  Physical Exam: BP 111/75   Pulse 62   Temp 97.7 F (36.5 C) (Temporal)   Ht 5' 10 (1.778 m)   Wt 242 lb (109.8 kg)   SpO2 97%   BMI 34.72 kg/m   Gen: No acute distress, resting comfortably CV: Regular rate and rhythm with no murmurs appreciated Pulm: Normal work of breathing, clear to auscultation bilaterally with no crackles, wheezes, or rhonchi Abdomen: Bowel sounds present, soft, nontender, nondistended MUSCULOSKELETAL:  - Back: No deformities.  Tenderness to palpation along left lower thoracic and upper lumbar paraspinal muscles.  This reproduces his pain.  Pain worsened with leftward rotation of trunk. Neuro: Grossly normal, moves all extremities Psych: Normal affect and thought content      Steffanie Mingle M. Kennyth, MD 10/16/2023 8:35 AM

## 2023-10-16 NOTE — Assessment & Plan Note (Signed)
 Worsened recently and may be contributing to his above LUQ pain.  Will try Protonix for a couple of weeks.  If no improvement will need referral to GI versus imaging.

## 2023-10-17 LAB — URINE CULTURE
MICRO NUMBER:: 16640714
Result:: NO GROWTH
SPECIMEN QUALITY:: ADEQUATE

## 2023-10-18 ENCOUNTER — Ambulatory Visit: Payer: Self-pay | Admitting: Family Medicine

## 2023-10-18 ENCOUNTER — Ambulatory Visit: Payer: 59 | Admitting: Family Medicine

## 2023-10-18 NOTE — Progress Notes (Signed)
 Urine culture is negative. He should let us  know if his pain is not improving.

## 2023-10-20 ENCOUNTER — Other Ambulatory Visit: Payer: Self-pay

## 2023-10-20 ENCOUNTER — Emergency Department (HOSPITAL_COMMUNITY)
Admission: EM | Admit: 2023-10-20 | Discharge: 2023-10-21 | Disposition: A | Discharge status: Home / Self Care | Attending: Emergency Medicine | Admitting: Emergency Medicine

## 2023-10-20 ENCOUNTER — Emergency Department (HOSPITAL_COMMUNITY)

## 2023-10-20 DIAGNOSIS — N179 Acute kidney failure, unspecified: Secondary | ICD-10-CM | POA: Diagnosis not present

## 2023-10-20 DIAGNOSIS — I10 Essential (primary) hypertension: Secondary | ICD-10-CM | POA: Diagnosis not present

## 2023-10-20 DIAGNOSIS — N132 Hydronephrosis with renal and ureteral calculous obstruction: Secondary | ICD-10-CM | POA: Insufficient documentation

## 2023-10-20 DIAGNOSIS — R1031 Right lower quadrant pain: Secondary | ICD-10-CM | POA: Diagnosis present

## 2023-10-20 DIAGNOSIS — N200 Calculus of kidney: Secondary | ICD-10-CM

## 2023-10-20 DIAGNOSIS — D72829 Elevated white blood cell count, unspecified: Secondary | ICD-10-CM | POA: Insufficient documentation

## 2023-10-20 DIAGNOSIS — Z79899 Other long term (current) drug therapy: Secondary | ICD-10-CM | POA: Diagnosis not present

## 2023-10-20 LAB — URINALYSIS, ROUTINE W REFLEX MICROSCOPIC
Bacteria, UA: NONE SEEN
Bilirubin Urine: NEGATIVE
Glucose, UA: NEGATIVE mg/dL
Ketones, ur: NEGATIVE mg/dL
Leukocytes,Ua: NEGATIVE
Nitrite: NEGATIVE
Protein, ur: NEGATIVE mg/dL
RBC / HPF: >50 RBC/hpf (ref 0–5)
Specific Gravity, Urine: 1.012 (ref 1.005–1.030)
pH: 7 (ref 5.0–8.0)

## 2023-10-20 LAB — CBC WITH DIFFERENTIAL/PLATELET
Abs Immature Granulocytes: 0.05 K/uL (ref 0.00–0.07)
Basophils Absolute: 0.1 K/uL (ref 0.0–0.1)
Basophils Relative: 1 %
Eosinophils Absolute: 0.2 K/uL (ref 0.0–0.5)
Eosinophils Relative: 1 %
HCT: 45.3 % (ref 39.0–52.0)
Hemoglobin: 15.6 g/dL (ref 13.0–17.0)
Immature Granulocytes: 0 %
Lymphocytes Relative: 37 %
Lymphs Abs: 4.5 K/uL — ABNORMAL HIGH (ref 0.7–4.0)
MCH: 30.5 pg (ref 26.0–34.0)
MCHC: 34.4 g/dL (ref 30.0–36.0)
MCV: 88.5 fL (ref 80.0–100.0)
Monocytes Absolute: 0.9 K/uL (ref 0.1–1.0)
Monocytes Relative: 7 %
Neutro Abs: 6.7 K/uL (ref 1.7–7.7)
Neutrophils Relative %: 54 %
Platelets: 363 K/uL (ref 150–400)
RBC: 5.12 MIL/uL (ref 4.22–5.81)
RDW: 14.2 % (ref 11.5–15.5)
WBC: 12.4 K/uL — ABNORMAL HIGH (ref 4.0–10.5)
nRBC: 0 % (ref 0.0–0.2)

## 2023-10-20 LAB — COMPREHENSIVE METABOLIC PANEL WITH GFR
ALT: 76 U/L — ABNORMAL HIGH (ref 0–44)
AST: 50 U/L — ABNORMAL HIGH (ref 15–41)
Albumin: 4.4 g/dL (ref 3.5–5.0)
Alkaline Phosphatase: 45 U/L (ref 38–126)
Anion gap: 13 (ref 5–15)
BUN: 18 mg/dL (ref 6–20)
CO2: 22 mmol/L (ref 22–32)
Calcium: 9.9 mg/dL (ref 8.9–10.3)
Chloride: 105 mmol/L (ref 98–111)
Creatinine, Ser: 1.99 mg/dL — ABNORMAL HIGH (ref 0.61–1.24)
GFR, Estimated: 42 mL/min — ABNORMAL LOW (ref >60)
Glucose, Bld: 145 mg/dL — ABNORMAL HIGH (ref 70–99)
Potassium: 3.7 mmol/L (ref 3.5–5.1)
Sodium: 140 mmol/L (ref 135–145)
Total Bilirubin: 1.5 mg/dL — ABNORMAL HIGH (ref 0.0–1.2)
Total Protein: 6.9 g/dL (ref 6.5–8.1)

## 2023-10-20 MED ORDER — OXYCODONE-ACETAMINOPHEN 5-325 MG PO TABS
1.0000 | ORAL_TABLET | Freq: Once | ORAL | Status: AC
  Administered 2023-10-20: 1 via ORAL
  Filled 2023-10-20: qty 1

## 2023-10-20 NOTE — ED Triage Notes (Signed)
 Patient reports sudden onset right flank pain with dysuria this evening , denies hematuria or injury .

## 2023-10-20 NOTE — ED Notes (Signed)
 Pt is pacing around lobby in extreme pain. Pt states that the medicine from triage RN is not helping.

## 2023-10-21 MED ORDER — ONDANSETRON 4 MG PO TBDP: 4.0000 mg | ORAL_TABLET | Freq: Three times a day (TID) | ORAL | 0 refills | Status: AC | PRN

## 2023-10-21 MED ORDER — HYDROMORPHONE HCL 1 MG/ML IJ SOLN
1.0000 mg | Freq: Once | INTRAMUSCULAR | Status: AC
  Administered 2023-10-21: 1 mg via INTRAVENOUS
  Filled 2023-10-21: qty 1

## 2023-10-21 MED ORDER — OXYCODONE-ACETAMINOPHEN 5-325 MG PO TABS
1.0000 | ORAL_TABLET | Freq: Three times a day (TID) | ORAL | 0 refills | Status: DC | PRN
Start: 2023-10-21 — End: 2023-10-23

## 2023-10-21 MED ORDER — LACTATED RINGERS IV BOLUS
1000.0000 mL | Freq: Once | INTRAVENOUS | Status: AC
  Administered 2023-10-21: 1000 mL via INTRAVENOUS

## 2023-10-21 MED ORDER — TAMSULOSIN HCL 0.4 MG PO CAPS: 0.4000 mg | ORAL_CAPSULE | Freq: Every day | ORAL | 0 refills | Status: DC

## 2023-10-21 MED ORDER — OXYCODONE-ACETAMINOPHEN 5-325 MG PO TABS
1.0000 | ORAL_TABLET | Freq: Once | ORAL | Status: AC
  Administered 2023-10-21: 1 via ORAL
  Filled 2023-10-21: qty 1

## 2023-10-21 MED ORDER — ONDANSETRON HCL 4 MG/2ML IJ SOLN
4.0000 mg | Freq: Once | INTRAMUSCULAR | Status: AC
  Administered 2023-10-21: 4 mg via INTRAVENOUS
  Filled 2023-10-21: qty 2

## 2023-10-21 NOTE — ED Notes (Signed)
 Questions and concerns addressed. Discharge teaching completed.   Prescriptions reviewed and pharmacy verified.   Pt ambulatory upon discharge.

## 2023-10-21 NOTE — Discharge Instructions (Addendum)
 Testing shows a small kidney stone on the right side.  Your appendix is normal.  The creatinine is slightly elevated likely due to dehydration.  Keep yourself hydrated.  Follow-up for recheck of kidney function next week with primary doctor or urologist.  Would avoid NSAID medications such as ibuprofen while kidney function is elevated.  Take the pain medication as prescribed and follow-up with the urologist.  Return to the ED with worsening pain, fever, vomiting, not able to urinate or other concerns.

## 2023-10-21 NOTE — ED Provider Notes (Signed)
 [place] EMERGENCY DEPARTMENT AT [facility] Provider Note   CSN: [id number] Arrival date & time: 10/20/23  2041     Patient presents with: Right Flank Pain  (Hx. Kidney Stone)   Jose James is a 43 y.o. male.   Patient with a history of hypertension, kidney stones, GERD, high cholesterol presenting with right-sided abdominal pain.  Pain started about 8 PM while he was lying on the sofa.  Pain starts in his right lower quadrant and radiates to his mid back.  Comes and goes in waves.  Improved with Percocet in the waiting room.  Nausea but no vomiting.  No blood in the urine.  Some dysuria and frequency.  No fever.  Does have history of kidney stones.  No testicular pain.  He was seen by his PCP several days ago with left-sided low back pain and was told he could have had a strain.  That pain is resolved.  Now having pain in the right side without radiation down his legs.  Still has appendix and gallbladder.  No chest pain or shortness of breath.  No fever.  No testicular pain.  The history is provided by the patient and the spouse.       Prior to Admission medications   Medication Sig Start Date End Date Taking? Authorizing Provider  albuterol (VENTOLIN HFA) 108 (90 Base) MCG/ACT inhaler SMARTSIG:2 Puff(s) By Mouth Every 4-6 Hours PRN 08/09/23   [provider]  allopurinol  (ZYLOPRIM ) 100 MG tablet Take 1 tablet (100 mg total) by mouth 2 (two) times daily. 08/21/23   Kennyth Worth HERO, MD  cyclobenzaprine  (FLEXERIL ) 10 MG tablet Take 1 tablet (10 mg total) by mouth 3 (three) times daily as needed for muscle spasms. 10/16/23   Kennyth Worth HERO, MD  meloxicam  (MOBIC ) 15 MG tablet TAKE 1 TABLET(15 MG) BY MOUTH DAILY AS NEEDED 04/06/22   Kennyth Worth HERO, MD  olmesartan -hydrochlorothiazide (BENICAR  HCT) 40-12.5 MG tablet Take 1 tablet by mouth daily. 06/15/23   Kennyth Worth HERO, MD  pantoprazole  (PROTONIX ) 40 MG tablet Take 1 tablet (40 mg total) by mouth daily. 10/16/23    Kennyth Worth HERO, MD  predniSONE  (DELTASONE ) 50 MG tablet Take 1 tablet daily for 5 days. 10/16/23   Kennyth Worth HERO, MD  valACYclovir  (VALTREX ) 1000 MG tablet Take 1 tablet (1,000 mg total) by mouth daily. 09/05/23   Kennyth Worth HERO, MD    Allergies: Penicillins    Review of Systems  Constitutional:  Negative for activity change, appetite change and fever.  Respiratory:  Negative for cough, chest tightness and shortness of breath.   Gastrointestinal:  Positive for abdominal pain and nausea. Negative for constipation and vomiting.  Genitourinary:  Positive for dysuria and urgency. Negative for hematuria and testicular pain.  Musculoskeletal:  Positive for back pain. Negative for arthralgias and myalgias.  Neurological:  Negative for dizziness, weakness and headaches.   all other systems are negative except as noted in the HPI and PMH.    Updated Vital Signs BP 97/87 (BP Location: Right Arm)   Pulse 62   Temp 98.1 F (36.7 C) (Oral)   Resp 17   SpO2 100%   Physical Exam Vitals and nursing note reviewed.  Constitutional:      General: He is not in acute distress.    Appearance: He is well-developed.  HENT:     Head: Normocephalic and atraumatic.     Mouth/Throat:     Pharynx: No oropharyngeal exudate.  Eyes:  Conjunctiva/sclera: Conjunctivae normal.     Pupils: Pupils are equal, round, and reactive to light.  Neck:     Comments: No meningismus. Cardiovascular:     Rate and Rhythm: Normal rate and regular rhythm.     Heart sounds: Normal heart sounds. No murmur heard. Pulmonary:     Effort: Pulmonary effort is normal. No respiratory distress.     Breath sounds: Normal breath sounds.  Abdominal:     Palpations: Abdomen is soft.     Tenderness: There is abdominal tenderness. There is no guarding or rebound.     Comments: Tender right upper quadrant right lower quadrant without guarding or rebound.  Intact femoral and DP pulses bilateral  Musculoskeletal:        General:  Tenderness present. Normal range of motion.     Cervical back: Normal range of motion and neck supple.     Comments: Right CVA tenderness  Skin:    General: Skin is warm.  Neurological:     Mental Status: He is alert and oriented to person, place, and time.     Cranial Nerves: No cranial nerve deficit.     Motor: No abnormal muscle tone.     Coordination: Coordination normal.     Comments:  5/5 strength throughout. CN 2-12 intact.Equal grip strength.   Psychiatric:        Behavior: Behavior normal.     (all labs ordered are listed, but only abnormal results are displayed) Labs Reviewed  CBC WITH DIFFERENTIAL/PLATELET - Abnormal; Notable for the following components:      Result Value   WBC 12.4 (*)    Lymphs Abs 4.5 (*)    All other components within normal limits  COMPREHENSIVE METABOLIC PANEL WITH GFR - Abnormal; Notable for the following components:   Glucose, Bld 145 (*)    Creatinine, Ser 1.99 (*)    AST 50 (*)    ALT 76 (*)    Total Bilirubin 1.5 (*)    GFR, Estimated 42 (*)    All other components within normal limits  URINALYSIS, ROUTINE W REFLEX MICROSCOPIC - Abnormal; Notable for the following components:   Hgb urine dipstick MODERATE (*)    All other components within normal limits    EKG: None  Radiology: CT Renal Stone Study Result Date: 10/20/2023 CLINICAL DATA:  Symptomatic urolithiasis.  Kidney stones. EXAM: CT ABDOMEN AND PELVIS WITHOUT CONTRAST TECHNIQUE: Multidetector CT imaging of the abdomen and pelvis was performed following the standard protocol without IV contrast. RADIATION DOSE REDUCTION: This exam was performed according to the departmental dose-optimization program which includes automated exposure control, adjustment of the mA and/or kV according to patient size and/or use of iterative reconstruction technique. COMPARISON:  09/11/2011 FINDINGS: Lower chest: Calcified granuloma in the left lung base. Lung bases are otherwise clear. Hepatobiliary: No  focal liver abnormality is seen. No gallstones, gallbladder wall thickening, or biliary dilatation. Pancreas: Unremarkable. No pancreatic ductal dilatation or surrounding inflammatory changes. Spleen: Normal in size without focal abnormality. Adrenals/Urinary Tract: No adrenal gland nodules. Numerous small stones throughout both kidneys, largest measuring up to 3 mm diameter. On the right, there is mild hydronephrosis and hydroureter with stranding around the right ureter. There is a 2.5 mm stone in the distal right ureter about 1 cm above the ureterovesical junction. No ureteral stone or obstruction on the left. Bladder is normal. Stomach/Bowel: Stomach is within normal limits. Appendix appears normal. No evidence of bowel wall thickening, distention, or inflammatory changes. Vascular/Lymphatic: No  significant vascular findings are present. No enlarged abdominal or pelvic lymph nodes. Reproductive: Prostate is unremarkable. Other: No abdominal wall hernia or abnormality. No abdominopelvic ascites. Musculoskeletal: No acute or significant osseous findings. IMPRESSION: 1. 2.5 mm stone in the distal right ureter with moderate proximal obstruction. 2. Multiple additional punctate sized nonobstructing intrarenal stones bilaterally. Electronically Signed   By: Elsie Gravely M.D.   On: 10/20/2023 21:10     Procedures   Medications Ordered in the ED  lactated ringers  bolus 1,000 mL (has no administration in time range)  ondansetron  (ZOFRAN ) injection 4 mg (has no administration in time range)  HYDROmorphone  (DILAUDID ) injection 1 mg (has no administration in time range)  oxyCODONE -acetaminophen  (PERCOCET/ROXICET) 5-325 MG per tablet 1 tablet (1 tablet Oral Given 10/20/23 2051)                                    Medical Decision Making Amount and/or Complexity of Data Reviewed Independent Historian: spouse Labs: ordered. Decision-making details documented in ED Course. Radiology: ordered and independent  interpretation performed. Decision-making details documented in ED Course. ECG/medicine tests: ordered and independent interpretation performed. Decision-making details documented in ED Course.  Risk Prescription drug management.   Right sided abdominal pain with urinary frequency, nausea, dysuria.  Stable vitals on arrival.  No fever.  Concern for appendicitis versus kidney stone.  Did have similar pain in the left side several days ago but this is resolved.  Does have known history of kidney stones.  Labs show minimal leukocytosis 12.1.  Creatinine elevated 1.9 from 1.5.  Urinalysis negative for infection but does show hematuria.  CT scan does show distal right sided kidney stone with obstruction.  Appendix is normal.  Discussed with urology Dr. Francisca.  He agrees as long as pain and nausea are controlled can follow-up as an outpatient.  Elevated creatinine likely secondary to dehydration.  Will avoid Toradol .  Patient feels improved after medications in the ED.  Resting comfortably.  Tolerating p.o.  Urinalysis negative for infection.  Creatinine slightly elevated as above.  IV fluids given.  Discussed pain control, nausea control and outpatient urology follow-up.  Return to the ED if worsening pain, fever, vomiting, unable to urinate or other concerns Follow-up with PCP or urologist for recheck of creatinine next week.     Final diagnoses:  None    ED Discharge Orders     None          Dhruva Orndoff, Garnette, MD 10/21/23 [phone]

## 2023-10-23 ENCOUNTER — Encounter: Payer: Self-pay | Admitting: Family Medicine

## 2023-10-23 ENCOUNTER — Ambulatory Visit: Admitting: Family Medicine

## 2023-10-23 VITALS — BP 102/78 | HR 77 | Temp 97.5°F | Ht 70.0 in | Wt 237.0 lb

## 2023-10-23 DIAGNOSIS — N200 Calculus of kidney: Secondary | ICD-10-CM

## 2023-10-23 DIAGNOSIS — R7989 Other specified abnormal findings of blood chemistry: Secondary | ICD-10-CM | POA: Diagnosis not present

## 2023-10-23 LAB — POCT URINALYSIS DIPSTICK
Bilirubin, UA: NEGATIVE
Blood, UA: POSITIVE
Glucose, UA: NEGATIVE
Ketones, UA: NEGATIVE
Leukocytes, UA: NEGATIVE
Nitrite, UA: NEGATIVE
Protein, UA: NEGATIVE
Spec Grav, UA: 1.01 (ref 1.010–1.025)
Urobilinogen, UA: 0.2 U/dL
pH, UA: 6 (ref 5.0–8.0)

## 2023-10-23 MED ORDER — OXYCODONE-ACETAMINOPHEN 10-325 MG PO TABS: 1.0000 | ORAL_TABLET | ORAL | 0 refills | Status: AC | PRN

## 2023-10-23 MED ORDER — TAMSULOSIN HCL 0.4 MG PO CAPS: 0.8000 mg | ORAL_CAPSULE | Freq: Every day | ORAL | 0 refills | Status: AC

## 2023-10-23 NOTE — Patient Instructions (Signed)
 It was very nice to see you today!  I will refill your medications.  Please increase your Flomax  to 0.8 mg daily.  Please keep your follow-up appointment with urology.  It may take 1 to 2 weeks before your stone fully passes.  Return if symptoms worsen or fail to improve.   Take care, Dr Kennyth  PLEASE NOTE:  If you had any lab tests, please let us  know if you have not heard back within a few days. You may see your results on mychart before we have a chance to review them but we will give you a call once they are reviewed by us .   If we ordered any referrals today, please let us  know if you have not heard from their office within the next week.   If you had any urgent prescriptions sent in today, please check with the pharmacy within an hour of our visit to make sure the prescription was transmitted appropriately.   Please try these tips to maintain a healthy lifestyle:  Eat at least 3 REAL meals and 1-2 snacks per day.  Aim for no more than 5 hours between eating.  If you eat breakfast, please do so within one hour of getting up.   Each meal should contain half fruits/vegetables, one quarter protein, and one quarter carbs (no bigger than a computer mouse)  Cut down on sweet beverages. This includes juice, soda, and sweet tea.   Drink at least 1 glass of water with each meal and aim for at least 8 glasses per day  Exercise at least 150 minutes every week.

## 2023-10-23 NOTE — Progress Notes (Signed)
   Jose James is a 43 y.o. male who presents today for an office visit.  Assessment/Plan:  Nephrolithiasis  No red flags.  Reviewed recent ED visit.  Has a 2.5 mm stone in right ureter with moderate obstruction.  He will continue to strain his urine and take it to urology for stone analysis.  Will refill his Percocet today though increase to 10 mg every 4-6 hours as needed for better pain control.  Also recommended he increase his Flomax  to 0.8 mg daily.  Also recommended increasing fluid intake to least 64 ounces of fluid daily.  We need to avoid NSAIDs due to his elevated creatinine.  Anticipate that his stone will pass within the next several days however he will follow-up with urology soon.  We discussed reasons to return to care.  Elevated creatinine Patient's creatinine 1.99 in the ED secondary to obstructive uropathy due to above nephrolithiasis.  Advised them that we need to avoid NSAIDs until the stone passes.  Anticipate that this will return to baseline after stone passes.  We can recheck labs in a few weeks after stone passes.    Subjective:  HPI:  See A/P for status of chronic conditions.  Patient is here for ED follow-up.  Was in the ED 3 days ago with right lower back pain.  Went to the emergency room for this and had workup showed nephrolithiasis.  He was given IV fluids and pain medications as well as flomax .  Pain is about the same the last few days.  They called to schedule appoint with urology and waiting on a call back currently.  Pain seems to hit in waves.  Does select the Percocet helps modestly though thinks this could be stronger.  He has been tolerating his Flomax  well.       Objective:  Physical Exam: BP 102/78 (BP Location: Left Arm, Patient Position: Sitting, Cuff Size: Normal)   Pulse 77   Temp (!) 97.5 F (36.4 C)   Ht 5' 10 (1.778 m)   Wt 237 lb (107.5 kg)   SpO2 98%   BMI 34.01 kg/m   Gen: No acute distress, resting comfortably Neuro: Grossly  normal, moves all extremities Psych: Normal affect and thought content  Time Spent: 30 minutes of total time was spent on the date of the encounter performing the following actions: chart review prior to seeing the patient including recent Emergency Department visit, obtaining history, performing a medically necessary exam, counseling on the treatment plan, placing orders, and documenting in our EHR.        Worth HERO. Kennyth, MD 10/23/2023 9:02 AM

## 2024-01-29 ENCOUNTER — Encounter: Payer: Self-pay | Admitting: Family Medicine

## 2024-01-30 ENCOUNTER — Other Ambulatory Visit: Payer: Self-pay | Admitting: *Deleted

## 2024-01-30 DIAGNOSIS — R748 Abnormal levels of other serum enzymes: Secondary | ICD-10-CM

## 2024-01-30 NOTE — Telephone Encounter (Signed)
**Note De-identified  [place] Obfuscation** Please advise 

## 2024-01-30 NOTE — Telephone Encounter (Signed)
 We were avoiding NSAIDs due to his kidney numbers being elevated.  This includes meloxicam .  Recommend he come in to recheck bmet to make sure that his kidney numbers have improved before we restart this.

## 2024-02-02 NOTE — Telephone Encounter (Signed)
 See note

## 2024-02-02 NOTE — Telephone Encounter (Signed)
 We can send in a prednisone  burst 20 mg daily for 7 days but I need to recheck his kidney numbers before he goes back on any NSAID.  Worth HERO. Kennyth, MD 02/02/2024 11:25 AM

## 2024-02-05 ENCOUNTER — Other Ambulatory Visit: Payer: Self-pay | Admitting: *Deleted

## 2024-02-05 ENCOUNTER — Other Ambulatory Visit (INDEPENDENT_AMBULATORY_CARE_PROVIDER_SITE_OTHER)

## 2024-02-05 ENCOUNTER — Ambulatory Visit: Payer: Self-pay

## 2024-02-05 DIAGNOSIS — R7989 Other specified abnormal findings of blood chemistry: Secondary | ICD-10-CM

## 2024-02-05 DIAGNOSIS — R748 Abnormal levels of other serum enzymes: Secondary | ICD-10-CM | POA: Diagnosis not present

## 2024-02-05 MED ORDER — PREDNISONE 20 MG PO TABS: 20.0000 mg | ORAL_TABLET | Freq: Every day | ORAL | 0 refills | Status: AC

## 2024-02-05 NOTE — Telephone Encounter (Signed)
 Left message to return call to our office at their convenience.  Rx send to Pharmacy

## 2024-02-05 NOTE — Telephone Encounter (Signed)
 FYI Only or Action Required?: Action required by provider: clinical question for provider.  Patient was last seen in primary care on 10/23/2023 by Jose Worth HERO, MD.  Called Nurse Triage reporting Medication Problem.   Triage Disposition: Call PCP Now  Patient/caregiver understands and will follow disposition?: No, wishes to speak with PCP           Copied from CRM #[id number]. Topic: Clinical - Medication Question >> Feb 05, 2024 12:41 PM Jose James wrote: Reason for CRM: Dr. Kennyth prescribed prednisone  based on mychart messages. He has questions about this medicaiton. Reason for Disposition  [1] Caller has URGENT medicine question about med that primary care doctor (or NP/PA) or specialist prescribed AND [2] triager unable to answer question    Pt is concerned about recent prednisone  Rx. Triager reviewed PCP notes from today. Triager also informed pt of general indications of prednisone  for reducing inflammation. Pt reports gout flare up is over and endorses that Meloxicam  is the only medication that provides him relief.   Triager will forward encounter for Dr Jose 's office to review and advise. Patient verbalized understanding and states that he has a blood draw in office today and will address with PCP then.  Answer Assessment - Initial Assessment Questions 1. NAME of MEDICINE: What medicine(s) are you calling about?     predniSONE  (DELTASONE ) 20 MG 2. QUESTION: What is your question? (e.g., double dose of medicine, side effect)     Pt is unsure why he is prescribed this medication. 3. PRESCRIBER: Who prescribed the medicine? Reason: if prescribed by specialist, call should be referred to that group.     Jose Worth HERO, MD 4. SYMPTOMS: Do you have any symptoms? If Yes, ask: What symptoms are you having?  How bad are the symptoms (e.g., mild, moderate, severe)     Joint pain - pt endorses flare up is over. 5. PREGNANCY:  Is there any chance that you are  pregnant? When was your last menstrual period?     N/a  Protocols used: Medication Question Call-A-AH

## 2024-02-06 ENCOUNTER — Ambulatory Visit: Payer: Self-pay | Admitting: Family Medicine

## 2024-02-06 ENCOUNTER — Other Ambulatory Visit: Payer: Self-pay | Admitting: Family Medicine

## 2024-02-06 LAB — COMPREHENSIVE METABOLIC PANEL WITH GFR
ALT: 62 U/L — ABNORMAL HIGH (ref 0–53)
AST: 41 U/L — ABNORMAL HIGH (ref 0–37)
Albumin: 4.9 g/dL (ref 3.5–5.2)
Alkaline Phosphatase: 50 U/L (ref 39–117)
BUN: 19 mg/dL (ref 6–23)
CO2: 25 meq/L (ref 19–32)
Calcium: 9.8 mg/dL (ref 8.4–10.5)
Chloride: 103 meq/L (ref 96–112)
Creatinine, Ser: 1.4 mg/dL (ref 0.40–1.50)
GFR: 61.21 mL/min (ref >60.00)
Glucose, Bld: 91 mg/dL (ref 70–99)
Potassium: 4.1 meq/L (ref 3.5–5.1)
Sodium: 139 meq/L (ref 135–145)
Total Bilirubin: 0.7 mg/dL (ref 0.2–1.2)
Total Protein: 6.7 g/dL (ref 6.0–8.3)

## 2024-02-06 LAB — TESTOSTERONE: Testosterone: 311.34 ng/dL (ref 300.00–890.00)

## 2024-02-06 LAB — BASIC METABOLIC PANEL WITH GFR
BUN: 19 mg/dL (ref 6–23)
CO2: 25 meq/L (ref 19–32)
Calcium: 9.8 mg/dL (ref 8.4–10.5)
Chloride: 103 meq/L (ref 96–112)
Creatinine, Ser: 1.4 mg/dL (ref 0.40–1.50)
GFR: 61.21 mL/min (ref >60.00)
Glucose, Bld: 91 mg/dL (ref 70–99)
Potassium: 4.1 meq/L (ref 3.5–5.1)
Sodium: 139 meq/L (ref 135–145)

## 2024-02-06 MED ORDER — MELOXICAM 15 MG PO TABS: ORAL_TABLET | ORAL | 1 refills | Status: AC

## 2024-02-06 NOTE — Progress Notes (Signed)
 His liver numbers are elevated but better than the last time that we checked.  His kidney numbers are back to normal and all of his other labs are at goal.  We can recheck again in a few months however it is for safe for him to resume his meloxicam .  I will refill this for him.  He should let us  know if he needs any further assistance.

## 2024-02-22 ENCOUNTER — Ambulatory Visit: Payer: Self-pay

## 2024-02-22 NOTE — Telephone Encounter (Addendum)
 FYI Only or Action Required?: FYI only for provider: appointment scheduled on 11/7.  Patient was last seen in primary care on 10/23/2023 by Kennyth Worth HERO, MD.  Called Nurse Triage reporting Dysuria.  Symptoms began today.  Interventions attempted: Other: Seen by urgent telehealth, UA ordered, started on abs.  Symptoms are: stable.  Triage Disposition: See Physician Within 24 Hours, See HCP Within 4 Hours (Or PCP Triage)  Patient/caregiver understands and will follow disposition?: Would like to see PCP to make sure he hasn't developed prostatitis again. He says his symptoms feel similar to what he experienced before.    Copied from CRM #[id number]. Topic: Clinical - Red Word Triage >> Feb 22, 2024  9:12 AM Jose James wrote: Red Word that prompted transfer to Nurse Triage:  He has pain in bladder area and when he goes to bathroom. Pain level is 8. He is at [employer] to give a urine sample.    ----------------------------------------------------------------------- From previous Reason for Contact - Scheduling: Patient/patient representative is calling to schedule an appointment. Refer to attachments for appointment information. Reason for Disposition  Side (flank) or lower back pain present  All other males with painful urination  Answer Assessment - Initial Assessment Questions 1. SEVERITY: How bad is the pain?  (e.g., Scale 1-10; mild, moderate, or severe)     Mild pain, decreased from this morning  2. FREQUENCY: How many times have you had painful urination today?      3-4 this morning since 6 am  3. PATTERN: Is pain present every time you urinate or just sometimes?      Had some pain with every episode, felt something pop a few moments and now feels better  4. ONSET: When did the painful urination start?      Started this morning  5. FEVER: Do you have a fever? If Yes, ask: What is your temperature, how was it measured, and when did it start?     Unsure if he has  a fever, but felt flushed today  6. PAST UTI: Have you had a urine infection before? If Yes, ask: When was the last time? and What happened that time?      Unsure  7. CAUSE: What do you think is causing the painful urination?      Unsure of cause  8. OTHER SYMPTOMS: Do you have any other symptoms? (e.g., flank pain, penis discharge, scrotal pain, blood in urine)     Bladder pain, urine was darker after, flank pain (bilateral)  Protocols used: Urination Pain - Male-A-AH

## 2024-02-23 ENCOUNTER — Ambulatory Visit: Admitting: Family Medicine

## 2024-02-23 ENCOUNTER — Encounter: Payer: Self-pay | Admitting: Family Medicine

## 2024-02-23 VITALS — BP 130/85 | HR 62 | Temp 98.1°F | Ht 70.0 in | Wt 244.0 lb

## 2024-02-23 DIAGNOSIS — I10 Essential (primary) hypertension: Secondary | ICD-10-CM

## 2024-02-23 DIAGNOSIS — R41840 Attention and concentration deficit: Secondary | ICD-10-CM | POA: Insufficient documentation

## 2024-02-23 DIAGNOSIS — R300 Dysuria: Secondary | ICD-10-CM

## 2024-02-23 DIAGNOSIS — R350 Frequency of micturition: Secondary | ICD-10-CM | POA: Diagnosis not present

## 2024-02-23 LAB — POCT URINALYSIS DIPSTICK
Bilirubin, UA: NEGATIVE
Blood, UA: NEGATIVE
Glucose, UA: NEGATIVE
Ketones, UA: NEGATIVE
Leukocytes, UA: NEGATIVE
Nitrite, UA: NEGATIVE
Protein, UA: NEGATIVE
Spec Grav, UA: 1.02 (ref 1.010–1.025)
Urobilinogen, UA: NEGATIVE U/dL
pH, UA: 6 (ref 5.0–8.0)

## 2024-02-23 NOTE — Assessment & Plan Note (Signed)
 Patient has noticed increased difficulty with staying focused and on task.  He did ask about diagnosis for ADHD.  We discussed referral to ADHD specialist.  Patient like to hold off on this for now but will let us  know if he changes his mind.

## 2024-02-23 NOTE — Assessment & Plan Note (Signed)
Blood pressure at goal today on olmesartan-HCTZ 40-12.5 once daily.  

## 2024-02-23 NOTE — Patient Instructions (Signed)
 It was very nice to see you today!  VISIT SUMMARY: Today, you were seen for pain during urination and blood in your urine. Your symptoms have resolved, and initial tests did not show an infection.  YOUR PLAN: POSSIBLE URINARY TRACT INFECTION (UTI): You had pain during urination and blood in your urine, but your symptoms have resolved and initial tests did not show an infection. -A urine culture has been ordered to check for any infection. We will contact you with the results on Monday. -Keep the Bactrim medication and resume taking it if your symptoms return. -You can take Tylenol  or ibuprofen if you experience any pain.  Return if symptoms worsen or fail to improve.   Take care, Dr Kennyth  PLEASE NOTE:  If you had any lab tests, please let us  know if you have not heard back within a few days. You may see your results on mychart before we have a chance to review them but we will give you a call once they are reviewed by us .   If we ordered any referrals today, please let us  know if you have not heard from their office within the next week.   If you had any urgent prescriptions sent in today, please check with the pharmacy within an hour of our visit to make sure the prescription was transmitted appropriately.   Please try these tips to maintain a healthy lifestyle:  Eat at least 3 REAL meals and 1-2 snacks per day.  Aim for no more than 5 hours between eating.  If you eat breakfast, please do so within one hour of getting up.   Each meal should contain half fruits/vegetables, one quarter protein, and one quarter carbs (no bigger than a computer mouse)  Cut down on sweet beverages. This includes juice, soda, and sweet tea.   Drink at least 1 glass of water with each meal and aim for at least 8 glasses per day  Exercise at least 150 minutes every week.

## 2024-02-23 NOTE — Telephone Encounter (Signed)
 Appt today

## 2024-02-23 NOTE — Progress Notes (Signed)
   Jose James is a 43 y.o. male who presents today for an office visit.  Assessment/Plan:  New/Acute Problems: Dysuria  Symptoms have resolved since yesterday.  UA today is normal though interestingly did have a UA with blood at [facility] yesterday.  It is possible that he may have had a kidney stone that he has since passed.  Given that his UA today is normal and his symptom has resolved do not think we need to start any antibiotics at this time though he did have 1 dose of Bactrim that was prescribed by telehealth professional yesterday.  Will check urine culture to definitively rule out UTI.  Advised patient to do Bactrim if he has any recurrence of symptoms.  Chronic Problems Addressed Today: Hypertension Blood pressure at goal today on olmesartan  HCTZ 40-12.5 once daily.  Inattention Patient has noticed increased difficulty with staying focused and on task.  He did ask about diagnosis for ADHD.  We discussed referral to ADHD specialist.  Patient like to hold off on this for now but will let us  know if he changes his mind.     Subjective:  HPI:  See assessment / plan for status of chronic conditions.  Discussed the use of AI scribe software for clinical note transcription with the patient, who gave verbal consent to proceed.  History of Present Illness Jose James is a 43 year old male who presents with dysuria and hematuria.  He began experiencing pain during urination at 6:00 AM on the day of the visit, accompanied by frequent urination, having to urinate five times before leaving work. He describes a sensation of pressure in the bladder that worsened over time.  He underwent testing at [facility]; he was told there was blood in his urine, but the urinalysis results reviewed by the doctor did not show blood or signs of infection. While at [facility], he experienced a sudden movement in his bladder, after which the pain ceased immediately and has not returned since.  He was  prescribed sulfamethoxazole and trimethoprim (Bactrim) and took one dose yesterday. He has not experienced any recurrence of symptoms since taking the medication.  His wife recently recovered from a urinary tract infection, but he is unsure if there is any connection. He inquired about the possibility of prostatitis but has not experienced flu-like symptoms, body aches, or fever.         Objective:  Physical Exam: BP 130/85   Pulse 62   Temp 98.1 F (36.7 C)   Ht 5' 10 (1.778 m)   Wt 244 lb (110.7 kg)   SpO2 95%   BMI 35.01 kg/m   Gen: No acute distress, resting comfortably Neuro: Grossly normal, moves all extremities Psych: Normal affect and thought content      Santiaga Butzin M. Kennyth, MD 02/23/2024 12:07 PM

## 2024-02-24 LAB — URINE CULTURE
MICRO NUMBER:: 17206695
Result:: NO GROWTH
SPECIMEN QUALITY:: ADEQUATE

## 2024-02-26 ENCOUNTER — Ambulatory Visit: Payer: Self-pay | Admitting: Family Medicine

## 2024-02-26 NOTE — Progress Notes (Signed)
 Urine culture is negative.  It is okay for him to stay off the antibiotic but he should let us  know if he has any recurrence of symptoms.

## 2024-04-30 ENCOUNTER — Encounter: Payer: Self-pay | Admitting: Family Medicine

## 2024-05-01 ENCOUNTER — Other Ambulatory Visit: Payer: Self-pay | Admitting: *Deleted

## 2024-05-01 DIAGNOSIS — M109 Gout, unspecified: Secondary | ICD-10-CM

## 2024-05-01 MED ORDER — ALLOPURINOL 100 MG PO TABS: 100.0000 mg | ORAL_TABLET | Freq: Two times a day (BID) | ORAL | 1 refills | Status: AC

## 2024-05-28 ENCOUNTER — Encounter: Admitting: Sleep Medicine

## 2024-06-17 ENCOUNTER — Encounter: Payer: 59 | Admitting: Family Medicine
